# Patient Record
Sex: Female | Born: 1935 | Race: White | Hispanic: No | Smoking: Never smoker
Health system: Southern US, Community
[De-identification: ages and names within clinical notes are randomized; demographics above are authoritative.]

## PROBLEM LIST (undated history)

## (undated) DIAGNOSIS — I10 Essential (primary) hypertension: Secondary | ICD-10-CM

## (undated) DIAGNOSIS — E119 Type 2 diabetes mellitus without complications: Secondary | ICD-10-CM

## (undated) DIAGNOSIS — I2510 Atherosclerotic heart disease of native coronary artery without angina pectoris: Secondary | ICD-10-CM

## (undated) DIAGNOSIS — N289 Disorder of kidney and ureter, unspecified: Secondary | ICD-10-CM

## (undated) DIAGNOSIS — F419 Anxiety disorder, unspecified: Secondary | ICD-10-CM

## (undated) DIAGNOSIS — I5032 Chronic diastolic (congestive) heart failure: Secondary | ICD-10-CM

## (undated) DIAGNOSIS — I480 Paroxysmal atrial fibrillation: Secondary | ICD-10-CM

## (undated) DIAGNOSIS — Z8673 Personal history of transient ischemic attack (TIA), and cerebral infarction without residual deficits: Secondary | ICD-10-CM

## (undated) DIAGNOSIS — I214 Non-ST elevation (NSTEMI) myocardial infarction: Secondary | ICD-10-CM

## (undated) DIAGNOSIS — E782 Mixed hyperlipidemia: Secondary | ICD-10-CM

## (undated) HISTORY — PX: CHOLECYSTECTOMY: SHX55

## (undated) HISTORY — DX: Disorder of kidney and ureter, unspecified: N28.9

## (undated) HISTORY — DX: Personal history of transient ischemic attack (TIA), and cerebral infarction without residual deficits: Z86.73

## (undated) HISTORY — DX: Atherosclerotic heart disease of native coronary artery without angina pectoris: I25.10

## (undated) HISTORY — DX: Essential (primary) hypertension: I10

## (undated) HISTORY — PX: OTHER SURGICAL HISTORY: SHX169

## (undated) HISTORY — DX: Type 2 diabetes mellitus without complications: E11.9

## (undated) HISTORY — DX: Mixed hyperlipidemia: E78.2

## (undated) HISTORY — PX: BACK SURGERY: SHX140

## (undated) HISTORY — DX: Non-ST elevation (NSTEMI) myocardial infarction: I21.4

---

## 2007-11-27 ENCOUNTER — Inpatient Hospital Stay (HOSPITAL_COMMUNITY): Admission: RE | Admit: 2007-11-27 | Discharge: 2007-12-01 | Payer: Self-pay | Admitting: Neurosurgery

## 2009-11-29 ENCOUNTER — Ambulatory Visit: Payer: Self-pay | Admitting: Cardiology

## 2010-07-26 NOTE — Op Note (Signed)
[name], [name]             ACCOUNT NO.:  [id number]   MEDICAL RECORD NO.:  [id number]          PATIENT TYPE:  INP   LOCATION:  5018                         FACILITY:  [facility]   PHYSICIAN:  Hilda Lias, M.D.   DATE OF BIRTH:  01-Jun-1924   DATE OF PROCEDURE:  11/27/2007  DATE OF DISCHARGE:                               OPERATIVE REPORT   PREOPERATIVE DIAGNOSES:  L4-L5 spondylolisthesis with right L4-L5  radiculopathy, status post diskectomy and fusion, June 2009.   POSTOPERATIVE DIAGNOSES:  L4-L5 spondylolisthesis with right L4-L5  radiculopathy, status post diskectomy and fusion, June 2009.   PROCEDURE:  L4 Gill procedure.  Bilateral L4-L5 diskectomy and  facetectomy, interbody fusion with cages, pedicle screws at L4-L5,  posterolateral arthrodesis with BMP cell saver.   CLINICAL HISTORY:  [name] is an 77 year old female who in June had  an L4-L5 diskectomy with fusion.  According to her family, she did worse  after surgery.  During surgery, they found quite a bit of CSF.  She has  unilateral transverse facet fusion.  She came to my office yesterday  complaining of worsening of the pain.  She had full conservative  treatment without improvement.  The patient wanted to proceed with  surgery.  The family knew all the risks including the possibility of no  improvement or worsening.   PROCEDURE:  The patient was taken to the OR and she was positioned in a  prone manner.  The back was cleaned with DuraPrep.  A midline incision  from the previous one was made.  We started dissection in the left side  first.  We were able to identify the L4 and L5 space.  On the right  side, we found the area where she had a surgery, which was mostly with  quite a bit of fibrosis.  Two x-rays were taken, which showed that  indeed we were at the level of the L4-L5.  Because of the CSF leak on  the right side, we approached the procedure on the left side, proceeding  with Gill procedure at L4  which included the facet.  We found quite a  bit of adhesion at the level of L4-L5 on the left side.  Lysis was  accomplished and we were able to get into disk space first on the left  side.  Then, we removed the facet on the right side.  We did quite a bit  of lysis of adhesions and we were able to decompress the L4 and L5 nerve  root.  We entered the disk space and we did a total close diskectomy to  decompress the area more than normal that is done for planned  diskectomy.  The reason for the total diskectomy all the way laterally  to be able to introduce the cages.  After the endplates were removed, 2  cages, 8 x 22 were inserted at the level of L4-L5 space using autograft  and BMP.  Then attention was turned to the right side.  We found some  yellow ligament compromising the L5 nerve root and that was cleaned.  Then at the  level of L4, we found the takeoff of L4 was  adherent to the  lateral wall.  Using the microdissection, we were able to free the L4  nerve root.  On top of it we found the patient has right after a tissue  graft as well as some stitch.  The takeoff of the L4 nerve root was  surrounded by granular tissue.  Then using the C-arm in AP and lateral  view, we probed the pedicles of L4-L5 and we introduced 4 screws of 5.5  x 4.5.  They were  kept in place with __________ and capped.  The periosteum of the  transverse process were removed and a mixture of BMP around the graft  was done for arthrodesis.  __________.  From there on the area was  irrigated and closed with Vicryl x 3.     ______________________________  Michel Harrow, M.D.    ______________________________  Hilda Lias, M.D.    KB/MEDQ  D:  11/27/2007  T:  11/28/2007  Job:  [id number]

## 2010-07-29 NOTE — Discharge Summary (Signed)
[name], [name]             ACCOUNT NO.:  [id number]   MEDICAL RECORD NO.:  [id number]          PATIENT TYPE:  INP   LOCATION:  5018                         FACILITY:  [facility]   PHYSICIAN:  Hilda Lias, M.D.   DATE OF BIRTH:  1924/05/21   DATE OF ADMISSION:  11/27/2007  DATE OF DISCHARGE:  12/01/2007                               DISCHARGE SUMMARY   ADMISSION DIAGNOSIS:  L4-L5 spondylolisthesis with bilateral  radiculopathy.   DISCHARGE DIAGNOSIS:  L4-L5 spondylolisthesis with bilateral  radiculopathy.   CLINICAL HISTORY:  The patient was admitted because of back pain  radiating towards the leg.  X-ray shows spondylolisthesis at the L4-L5.  Surgery was advised.   LABORATORY:  Normal.   HOSPITAL COURSE:  The patient was taken to surgery and L4-L5 fusion with  pedicle screw was done.  The patient did well up to the point and later  48 hours, she was discharged by Dr. Tressie Stalker.   CONDITION ON DISCHARGE:  Improvement.   MEDICATIONS:  Percocet and diazepam.   DIET:  Regular.   ACTIVITY:  Not to drive for at least 4 weeks.   FOLLOWUP:  She is going to be seen by me in my office in 4 weeks.  The  patient was discharged by Dr. Lovell Sheehan on November 30, 2007.           ______________________________  Hilda Lias, M.D.     EB/MEDQ  D:  12/31/2007  T:  12/31/2007  Job:  [id number]

## 2010-12-12 LAB — GLUCOSE, CAPILLARY
Glucose-Capillary: 107 — ABNORMAL HIGH
Glucose-Capillary: 120 — ABNORMAL HIGH
Glucose-Capillary: 123 — ABNORMAL HIGH
Glucose-Capillary: 135 — ABNORMAL HIGH
Glucose-Capillary: 136 — ABNORMAL HIGH
Glucose-Capillary: 160 — ABNORMAL HIGH
Glucose-Capillary: 191 — ABNORMAL HIGH
Glucose-Capillary: 91

## 2010-12-12 LAB — BASIC METABOLIC PANEL
CO2: 24
Calcium: 9.3
Chloride: 105
GFR calc Af Amer: >60
Glucose, Bld: 119 — ABNORMAL HIGH
Potassium: 3.5
Sodium: 137

## 2010-12-12 LAB — CBC
HCT: 34.3 — ABNORMAL LOW
Hemoglobin: 11.6 — ABNORMAL LOW
MCHC: 33.8
MCV: 89.5
RBC: 3.84 — ABNORMAL LOW
RDW: 12.7

## 2010-12-12 LAB — TYPE AND SCREEN: Antibody Screen: NEGATIVE

## 2011-06-02 ENCOUNTER — Inpatient Hospital Stay (HOSPITAL_COMMUNITY)
Admission: AD | Admit: 2011-06-02 | Discharge: 2011-06-08 | DRG: 281 | Disposition: A | Discharge status: Home / Self Care | Payer: Medicare Other | Source: Other Acute Inpatient Hospital | Attending: Cardiovascular Disease | Admitting: Cardiovascular Disease

## 2011-06-02 ENCOUNTER — Encounter (HOSPITAL_COMMUNITY): Payer: Self-pay | Admitting: *Deleted

## 2011-06-02 DIAGNOSIS — I2510 Atherosclerotic heart disease of native coronary artery without angina pectoris: Secondary | ICD-10-CM

## 2011-06-02 DIAGNOSIS — Z9181 History of falling: Secondary | ICD-10-CM

## 2011-06-02 DIAGNOSIS — I5032 Chronic diastolic (congestive) heart failure: Secondary | ICD-10-CM | POA: Diagnosis present

## 2011-06-02 DIAGNOSIS — I214 Non-ST elevation (NSTEMI) myocardial infarction: Secondary | ICD-10-CM | POA: Diagnosis present

## 2011-06-02 DIAGNOSIS — Z7982 Long term (current) use of aspirin: Secondary | ICD-10-CM

## 2011-06-02 DIAGNOSIS — R197 Diarrhea, unspecified: Secondary | ICD-10-CM | POA: Diagnosis present

## 2011-06-02 DIAGNOSIS — I495 Sick sinus syndrome: Secondary | ICD-10-CM

## 2011-06-02 DIAGNOSIS — I10 Essential (primary) hypertension: Secondary | ICD-10-CM | POA: Diagnosis present

## 2011-06-02 DIAGNOSIS — I498 Other specified cardiac arrhythmias: Secondary | ICD-10-CM | POA: Diagnosis present

## 2011-06-02 DIAGNOSIS — E876 Hypokalemia: Secondary | ICD-10-CM | POA: Diagnosis present

## 2011-06-02 DIAGNOSIS — I4891 Unspecified atrial fibrillation: Secondary | ICD-10-CM | POA: Diagnosis present

## 2011-06-02 DIAGNOSIS — E860 Dehydration: Secondary | ICD-10-CM | POA: Diagnosis present

## 2011-06-02 DIAGNOSIS — I509 Heart failure, unspecified: Secondary | ICD-10-CM | POA: Diagnosis present

## 2011-06-02 DIAGNOSIS — R079 Chest pain, unspecified: Secondary | ICD-10-CM

## 2011-06-02 DIAGNOSIS — Z79899 Other long term (current) drug therapy: Secondary | ICD-10-CM

## 2011-06-02 HISTORY — DX: Anxiety disorder, unspecified: F41.9

## 2011-06-02 HISTORY — DX: Chronic diastolic (congestive) heart failure: I50.32

## 2011-06-02 HISTORY — DX: Paroxysmal atrial fibrillation: I48.0

## 2011-06-03 ENCOUNTER — Encounter (HOSPITAL_COMMUNITY): Payer: Self-pay | Admitting: *Deleted

## 2011-06-03 DIAGNOSIS — E876 Hypokalemia: Secondary | ICD-10-CM | POA: Diagnosis present

## 2011-06-03 DIAGNOSIS — I214 Non-ST elevation (NSTEMI) myocardial infarction: Principal | ICD-10-CM

## 2011-06-03 DIAGNOSIS — I059 Rheumatic mitral valve disease, unspecified: Secondary | ICD-10-CM

## 2011-06-03 DIAGNOSIS — I4891 Unspecified atrial fibrillation: Secondary | ICD-10-CM

## 2011-06-03 LAB — CARDIAC PANEL(CRET KIN+CKTOT+MB+TROPI)
Relative Index: 3.6 — ABNORMAL HIGH (ref 0.0–2.5)
Relative Index: 2.8 — ABNORMAL HIGH (ref 0.0–2.5)
Total CK: 245 U/L — ABNORMAL HIGH (ref 7–177)
Troponin I: 5.08 ng/mL (ref <0.30)
Troponin I: 3.06 ng/mL (ref <0.30)
Troponin I: 2.25 ng/mL (ref <0.30)

## 2011-06-03 LAB — MRSA PCR SCREENING: MRSA by PCR: NEGATIVE

## 2011-06-03 LAB — GLUCOSE, CAPILLARY
Glucose-Capillary: 133 mg/dL — ABNORMAL HIGH (ref 70–99)
Glucose-Capillary: 145 mg/dL — ABNORMAL HIGH (ref 70–99)

## 2011-06-03 LAB — CBC
HCT: 33.6 % — ABNORMAL LOW (ref 36.0–46.0)
Hemoglobin: 11.5 g/dL — ABNORMAL LOW (ref 12.0–15.0)
MCH: 29.2 pg (ref 26.0–34.0)
MCHC: 34.2 g/dL (ref 30.0–36.0)
RDW: 13.6 % (ref 11.5–15.5)

## 2011-06-03 LAB — LIPID PANEL
Cholesterol: 121 mg/dL (ref 0–200)
HDL: 33 mg/dL — ABNORMAL LOW (ref >39)
Total CHOL/HDL Ratio: 3.7 RATIO
VLDL: 27 mg/dL (ref 0–40)

## 2011-06-03 LAB — BASIC METABOLIC PANEL
BUN: 24 mg/dL — ABNORMAL HIGH (ref 6–23)
Calcium: 8.5 mg/dL (ref 8.4–10.5)
GFR calc Af Amer: 45 mL/min — ABNORMAL LOW (ref >90)
GFR calc non Af Amer: 39 mL/min — ABNORMAL LOW (ref >90)
Glucose, Bld: 139 mg/dL — ABNORMAL HIGH (ref 70–99)

## 2011-06-03 LAB — HEPARIN LEVEL (UNFRACTIONATED)
Heparin Unfractionated: 0.1 IU/mL — ABNORMAL LOW (ref 0.30–0.70)
Heparin Unfractionated: 0.25 IU/mL — ABNORMAL LOW (ref 0.30–0.70)

## 2011-06-03 MED ORDER — POTASSIUM CHLORIDE CRYS ER 20 MEQ PO TBCR
40.0000 meq | EXTENDED_RELEASE_TABLET | Freq: Once | ORAL | Status: AC
  Administered 2011-06-03: 40 meq via ORAL

## 2011-06-03 MED ORDER — ALUM & MAG HYDROXIDE-SIMETH 200-200-20 MG/5ML PO SUSP
15.0000 mL | ORAL | Status: DC | PRN
  Administered 2011-06-04 – 2011-06-05 (×6): 30 mL via ORAL
  Filled 2011-06-03 (×6): qty 30

## 2011-06-03 MED ORDER — ALPRAZOLAM 0.25 MG PO TABS
0.2500 mg | ORAL_TABLET | Freq: Once | ORAL | Status: AC
  Administered 2011-06-03: 0.25 mg via ORAL

## 2011-06-03 MED ORDER — POTASSIUM CHLORIDE CRYS ER 20 MEQ PO TBCR
40.0000 meq | EXTENDED_RELEASE_TABLET | Freq: Two times a day (BID) | ORAL | Status: DC
  Administered 2011-06-03 – 2011-06-04 (×4): 40 meq via ORAL
  Filled 2011-06-03 (×7): qty 2

## 2011-06-03 MED ORDER — METOPROLOL TARTRATE 12.5 MG HALF TABLET
12.5000 mg | ORAL_TABLET | Freq: Two times a day (BID) | ORAL | Status: DC
  Administered 2011-06-03 – 2011-06-07 (×8): 12.5 mg via ORAL
  Filled 2011-06-03 (×10): qty 1

## 2011-06-03 MED ORDER — MAGNESIUM HYDROXIDE 400 MG/5ML PO SUSP
30.0000 mL | Freq: Every day | ORAL | Status: DC | PRN
  Administered 2011-06-03: 30 mL via ORAL
  Filled 2011-06-03: qty 30

## 2011-06-03 MED ORDER — ALPRAZOLAM 0.25 MG PO TABS
0.1250 mg | ORAL_TABLET | Freq: Two times a day (BID) | ORAL | Status: DC
  Administered 2011-06-03: 0.125 mg via ORAL
  Administered 2011-06-03: 16:00:00 via ORAL
  Administered 2011-06-04: 0.125 mg via ORAL
  Filled 2011-06-03 (×2): qty 1

## 2011-06-03 MED ORDER — NITROGLYCERIN IN D5W 200-5 MCG/ML-% IV SOLN
2.0000 ug/min | INTRAVENOUS | Status: DC
  Administered 2011-06-03 – 2011-06-04 (×2): 10 ug/min via INTRAVENOUS
  Administered 2011-06-04: 5 ug/min via INTRAVENOUS
  Filled 2011-06-03: qty 250

## 2011-06-03 MED ORDER — ALPRAZOLAM 0.25 MG PO TABS
0.2500 mg | ORAL_TABLET | Freq: Every day | ORAL | Status: DC
  Administered 2011-06-03 – 2011-06-07 (×5): 0.25 mg via ORAL
  Filled 2011-06-03 (×7): qty 1

## 2011-06-03 MED ORDER — NITROGLYCERIN 0.4 MG SL SUBL: 0.4000 mg | SUBLINGUAL_TABLET | SUBLINGUAL | Status: DC | PRN

## 2011-06-03 MED ORDER — ACETAMINOPHEN 325 MG PO TABS: 650.0000 mg | ORAL_TABLET | ORAL | Status: DC | PRN

## 2011-06-03 MED ORDER — HEPARIN (PORCINE) IN NACL 100-0.45 UNIT/ML-% IJ SOLN
1000.0000 [IU]/h | INTRAMUSCULAR | Status: DC
  Administered 2011-06-03 – 2011-06-04 (×2): 1150 [IU]/h via INTRAVENOUS
  Administered 2011-06-05 – 2011-06-06 (×2): 1000 [IU]/h via INTRAVENOUS
  Filled 2011-06-03 (×6): qty 250

## 2011-06-03 MED ORDER — SIMVASTATIN 20 MG PO TABS
20.0000 mg | ORAL_TABLET | Freq: Every evening | ORAL | Status: DC
  Administered 2011-06-03 – 2011-06-07 (×4): 20 mg via ORAL
  Filled 2011-06-03 (×6): qty 1

## 2011-06-03 MED ORDER — ASPIRIN 300 MG RE SUPP: 300.0000 mg | RECTAL | Status: AC

## 2011-06-03 MED ORDER — HYDRALAZINE HCL 10 MG PO TABS
10.0000 mg | ORAL_TABLET | Freq: Three times a day (TID) | ORAL | Status: DC
  Administered 2011-06-03 – 2011-06-08 (×15): 10 mg via ORAL
  Filled 2011-06-03 (×20): qty 1

## 2011-06-03 MED ORDER — ONDANSETRON HCL 4 MG/2ML IJ SOLN
4.0000 mg | Freq: Four times a day (QID) | INTRAMUSCULAR | Status: DC | PRN
  Administered 2011-06-03: 4 mg via INTRAVENOUS
  Filled 2011-06-03: qty 2

## 2011-06-03 MED ORDER — GUAIFENESIN-DM 100-10 MG/5ML PO SYRP
15.0000 mL | ORAL_SOLUTION | ORAL | Status: DC | PRN
  Filled 2011-06-03: qty 15

## 2011-06-03 MED ORDER — ALPRAZOLAM 0.25 MG PO TABS: 0.1250 mg | ORAL_TABLET | ORAL | Status: DC

## 2011-06-03 MED ORDER — SODIUM CHLORIDE 0.9 % IV SOLN
INTRAVENOUS | Status: AC
  Administered 2011-06-03 (×2): via INTRAVENOUS

## 2011-06-03 MED ORDER — ASPIRIN 81 MG PO CHEW: 324.0000 mg | CHEWABLE_TABLET | ORAL | Status: AC

## 2011-06-03 MED ORDER — ASPIRIN EC 81 MG PO TBEC
81.0000 mg | DELAYED_RELEASE_TABLET | Freq: Every day | ORAL | Status: DC
  Administered 2011-06-04 – 2011-06-08 (×5): 81 mg via ORAL
  Filled 2011-06-03 (×5): qty 1

## 2011-06-03 MED ORDER — HEPARIN BOLUS VIA INFUSION
2000.0000 [IU] | Freq: Once | INTRAVENOUS | Status: AC
  Administered 2011-06-03: 2000 [IU] via INTRAVENOUS
  Filled 2011-06-03: qty 2000

## 2011-06-03 NOTE — Progress Notes (Addendum)
  Patient Name: [name]      SUBJECTIVE: admitted from [facility] with AFib RVR and + Tn in setting of viral gastroenteritis like syndrome complicatted by dehydration and acute on chronic renal insufficiency  She is stillweak and with loose stools  Some chest burning Rx with mylanta this am with relief  Past Medical History  Diagnosis Date  . Hypertension   . Diabetes mellitus     borderline  . Stroke     mini stroke  . Anxiety     PHYSICAL EXAM Filed Vitals:   06/03/11 0500 06/03/11 0800 06/03/11 0802 06/03/11 1156  BP:  136/53 136/53 178/59  Pulse:  57 59 66  Temp:   97.9 F (36.6 C) 97.9 F (36.6 C)  TempSrc:   Oral Oral  Resp:  13 15 23   Height:      Weight: 155 lb 3.3 oz (70.4 kg)     SpO2:  100% 99% 100%    Well developed and nourished in no acute distress HENT normal Neck supple with JVP-flat Clear Regular rate and rhythm, no murmurs or gallops Abd-soft with active BS No Clubbing cyanosis edema Skin-warm and dry A & Oriented  Grossly normal sensory and motor function   TELEMETRY: Reviewed telemetry pt in NSR    Intake/Output Summary (Last 24 hours) at 06/03/11 1238 Last data filed at 06/03/11 1149  Gross per 24 hour  Intake 1403.25 ml  Output   1151 ml  Net 252.25 ml    LABS: Basic Metabolic Panel:  Lab 06/03/11 1610  NA 136  K 3.9  CL 107  CO2 19  GLUCOSE 139*  BUN 24*  CREATININE 1.23*  CALCIUM 8.5  MG --  PHOS --   Cardiac Enzymes:  Basename 06/03/11 0730 06/03/11 0043  CKTOTAL 245* 307*  CKMB 8.7* 11.0*  CKMBINDEX -- --  TROPONINI 3.06* 5.08*   CBC:  Lab 06/03/11 0730  WBC 7.1  NEUTROABS --  HGB 11.5*  HCT 33.6*  MCV 85.3  PLT 186   PROTIME:  Basename 06/03/11 0730  LABPROT 14.4  INR 1.10  Fasting Lipid Panel:  Basename 06/03/11 0730  CHOL 121  HDL 33*  LDLCALC 61  TRIG 960  CHOLHDL 3.7  LDLDIRECT --      ASSESSMENT AND PLAN:  Patient Active Hospital Problem List: NSTEMI (non-ST elevated  myocardial infarction)  With recurrent chestpain   Atrial fibrillation with rapid ventricular response (06/02/2011)  * Dehydration and diarrhea   Hypertension (06/02/2011)   Hypokalemia (06/03/2011)   will begin nitro for chest pain  And recycle enzymes    Has had bradycardia so will not adjust betablockers  Continue rehydratoin  Echo pending \ Cath prob tues as renal function normalizes    Signed, Sherryl Manges MD  06/03/2011

## 2011-06-03 NOTE — H&P (Signed)
[name], FONT             ACCOUNT NO.:  [id number]  MEDICAL RECORD NO.:  [id number]  LOCATION:  2605                         FACILITY:  [facility]  PHYSICIAN:  Natasha Bence, MD       DATE OF BIRTH:  04/17/1924  DATE OF ADMISSION:  06/02/2011 DATE OF DISCHARGE:                             HISTORY & PHYSICAL   CHIEF COMPLAINT:  Weakness and chest pain.  HISTORY OF PRESENT ILLNESS:  Ms. [name] is an 78 year old, white female who presented to the [facility] Emergency Department after several-day history of nausea, vomiting, and diarrhea.  She reports she has been very ill since Tuesday and has not been able to take any food down.  She has had multiple episodes of diarrhea and vomiting daily.  Last night, she was feeling very weak and when she arose from bed, she fell down. She did not hit her head or lose consciousness.  She felt very weak thereafter and abruptly developed substernal chest discomfort.  She thought this chest discomfort was related to her fall and catching herself; however, the chest pain did not hurt with movement.  She had no associated shortness of breath, but was profoundly diaphoretic during this episode.  She said it lasted at least 5-10 minutes and spontaneously resolved.  She had no palpitations or lightheadedness after this episode.  Prior to being sick this week, she has had no chest discomfort, shortness of breath, dyspnea on exertion.  She denies any history of PND, orthopnea or lower extremity edema.  She has never had any palpitations.  She does have chronic leg pain and back pain. Otherwise, she does not report any weight loss, fevers, chills, or sweats prior to this week.  She denies any bleeding or melena. In the [facility] Emergency Department, she was found to be in atrial fibrillation with rapid ventricular rate and spontaneously converted back to sinus mechanism on her own.  Currently, she feels little bit better, although weak and thirsty.  The  rest of complete review of systems was performed and was otherwise negative except for what was stated above.  PAST MEDICAL HISTORY: 1. Hypertension. 2. Dyslipidemia. 3. Carotid vascular disease. 4. History of a pancreatic mass, being followed by Oncology     conservatively. 5. Chronic back pain. 6. Prediabetes.  PAST SURGICAL HISTORY:  She had a cholecystectomy and back surgery, wrist surgery, and cataract surgery.  FAMILY HISTORY:  Mom with coronary artery disease in her 46s.  SOCIAL HISTORY:  She is nonsmoker and nondrinker.  She lives at home with her daughter.  ALLERGIES:  She has no known drug allergies.  MEDICATIONS:  She is on, 1. Simvastatin 20 mg p.o. at bedtime. 2. Zestril 10 mg p.o. daily. 3. Aspirin 325 mg p.o. daily. 4. Hydralazine 10 mg p.o. t.i.d. 5. Hydrochlorothiazide 12.5 mg p.o. daily.  She has been holding this     for a week. 6. Xanax 0.5 mg p.o. b.i.d. and at bedtime p.r.n. 7. Zofran 8 mg p.o. t.i.d. p.r.n.  PHYSICAL EXAMINATION:  VITAL SIGNS:  She is afebrile.  Temperature 97.8, pulse of 72 and regular, respiratory rate of 16, blood pressure 149/55, O2 sats 100% on room air. GENERAL:  She is  a well-developed, well-nourished, elderly white female, in no apparent distress. HEENT:  Eyes:  She has anicteric sclerae.  Pupils are equally round and reactive.  Mucous membranes are dry. NECK:  She has normal jugular venous pressure.  No carotid bruits. LUNGS:  Clear to auscultation bilaterally. CARDIOVASCULAR:  She has a regular rate and rhythm.  No murmurs, rubs, or gallops. ABDOMEN:  Soft, nontender, nondistended.  No bruits. EXTREMITIES:  Warm with no edema.  She has symmetrical pulses throughout. SKIN:  No rash or ulcers. NEUROLOGIC:  Grossly afocal.  LABS DONE AT [facility]:  She has a sodium of 133, potassium of 2.7, chloride of 98, bicarb of 19, BUN of 30, creatinine 1.53, glucose of 140, calcium of 8.9.  AST of 51, ALT of 22, alk phos of 56.   CK is 400, CK-MB was 27.9, troponin was 5.34.  BNP was 335.  TSH was 2.58 and lipase was 29.  PT was 12.8, INR was 0.9, PTT was 33.2.  White blood cell count of 7.4, hematocrit of 37.4, and platelet count of 175.  Her white count had a normal differential.  She had a CT of head done at that outside hospital showed no acute intracranial abnormality.  She had a small right frontal scalp hematoma, no skull fracture.  She had an old occipital lobe stroke and otherwise atrophy.  Abdominal x-ray showed possible left colonic wall thickening suspicious for colitis.  Both lungs were clear.  EKG; first one shows atrial fibrillation with a ventricular rate run about 100.  She has left bundle-branch block.  Repeat EKG shows sinus mechanism with a rate of 77 beats per minute.  She has intraventricular conduction delay and left axis deviation.  She has evidence for previous anterior infarction.  She also has ST depression anterolaterally.  IMPRESSION AND PLAN:  This is an 78 year old white female with recent viral gastroenteritis who presented with weakness and profound dehydration.  Subsequently, found to be an atrial fibrillation with rapid ventricular rate, which spontaneously converted.  She also had one episode of chest discomfort related to the sequelae.  EKG does have ischemic changes.  Her initial troponin at the outside hospital was 5.3. Currently, she is pain free and hemodynamically stable.  We will continue IV fluid hydration and replacement of her potassium, which are likely culprits for much of her complications.  We will place her on low- dose metoprolol.  Of note, she has had a history of bradycardia with rates in the 40s and near syncope home in the past, on beta-blocker. So, we will displaced her on this now to prevent her from further episodes of atrial fibrillation while she is in the hospital and likely discontinue this on discharge.  Normally, she is also on hydrochlorothiazide  and lisinopril for blood pressure; however, given her dehydration and acute renal dysfunction, we will hydrate her first and then restart this once her serum creatinine has normalized.  Her daughter does say she has some degree of renal dysfunction at baseline. We will treat her with antiemetics as needed; otherwise, she is already on aspirin and heparin infusion and simvastatin.  I feel her non-ST segment elevation myocardial infarction is likely related to profound dehydration and her atrial fibrillation.  We will check serial cardiac enzymes through the night and then in the morning. Check an echocardiogram to evaluate her left ventricular systolic function.  Depending on how this results come out, we may decide on the course of action with either invasive versus noninvasive workup  for her coronary artery disease.          ______________________________ Natasha Bence, MD     MH/MEDQ  D:  06/03/2011  T:  06/03/2011  Job:  [id number]

## 2011-06-03 NOTE — Progress Notes (Signed)
  Echocardiogram 2D Echocardiogram has been performed.  Diamante Rubin, Real Cons 06/03/2011, 2:54 PM

## 2011-06-03 NOTE — Progress Notes (Signed)
Received critical result from lab. CKMB-11.0, Troponin of 5.08. Will call the MD.

## 2011-06-03 NOTE — Progress Notes (Signed)
Pts. Cardiac enzyme result came back +, critical report received from lab. Pt. denies any chest pain or any other symptoms, Dr. Judie Petit. Margo Aye was paged and made aware. Wil cont. to monitor pt. Marland Kitchen

## 2011-06-03 NOTE — H&P (Signed)
See dictated note.  78 yo admitted with recent gastroenteritis and dehydration and profound hypokalemia developed weakness, afib with RVR and NSTEMI.  Troponin 5 and ECG with ischemic changes.  Currently pain free.  Also has some renal dysfunction.    Most of these complications are likely related to her gastroenteritis and dehydration leading to afib/RVR and NSTEMI.  Will follow serial troponins and get ECHO in am.  Hydrate with IVF's and replace K+.  On Heparin, ASA, Statin, metoprolol.

## 2011-06-03 NOTE — Progress Notes (Addendum)
ANTICOAGULATION CONSULT NOTE - Follow Up Consult  Pharmacy Consult for heparin Indication: afib/acs  No Known Allergies  Patient Measurements: Height: 5\' 3"  (160 cm) Weight: 155 lb 3.3 oz (70.4 kg) IBW/kg (Calculated) : 52.4  Heparin Dosing Weight:   Vital Signs: Temp: 97.9 F (36.6 C) (03/23 0802) Temp src: Oral (03/23 0802) BP: 136/53 mmHg (03/23 0802) Pulse Rate: 59  (03/23 0802)  Labs:  Basename 06/03/11 0730 06/03/11 0043  HGB -- --  HCT -- --  PLT -- --  APTT -- --  LABPROT 14.4 --  INR 1.10 --  HEPARINUNFRC 0.10* --  CREATININE 1.23* --  CKTOTAL 245* 307*  CKMB 8.7* 11.0*  TROPONINI 3.06* 5.08*   Estimated Creatinine Clearance: 32 ml/min (by C-G formula based on Cr of 1.23).  Assessment:  78 yo female with Afib/chest pain for Heparin. Heparin level subtherapeutic this AM. Platelets wnl. Per nurse, no issues with line overnight and no S/Sx bleeding.  Goal of Therapy:  Heparin level 0.3-0.7 units/ml   Plan:  Rebolus with Heparin 2000 units. Increase rate of Heparin infusion to 1050 units/hr = 10.5 mL/hr Follow up heparin level 8 hours after rate change and monitor CBC.  Goal of Therapy:  Heparin level 0.3-0.7 units/ml   Zoe Lan, PharmD pgr [phone] 06/03/2011, 8:39 AM

## 2011-06-03 NOTE — Progress Notes (Signed)
ANTICOAGULATION CONSULT NOTE - Follow Up Consult  Pharmacy Consult for heparin Indication: afib/acs  No Known Allergies  Patient Measurements: Height: 5\' 3"  (160 cm) Weight: 155 lb 3.3 oz (70.4 kg) IBW/kg (Calculated) : 52.4  Heparin Dosing Weight:   Vital Signs: Temp: 97.9 F (36.6 C) (03/23 1156) Temp src: Oral (03/23 1156) BP: 137/51 mmHg (03/23 1500) Pulse Rate: 55  (03/23 1500)  Labs:  Basename 06/03/11 1631 06/03/11 0730 06/03/11 0043  HGB -- 11.5* --  HCT -- 33.6* --  PLT -- 186 --  APTT -- -- --  LABPROT -- 14.4 --  INR -- 1.10 --  HEPARINUNFRC 0.25* 0.10* --  CREATININE -- 1.23* --  CKTOTAL -- 245* 307*  CKMB -- 8.7* 11.0*  TROPONINI -- 3.06* 5.08*   Estimated Creatinine Clearance: 32 ml/min (by C-G formula based on Cr of 1.23).  Assessment:  78 yo female with Afib/chest pain for Heparin. Heparin level still  subtherapeutic.   Goal of Therapy:  Heparin level 0.3-0.7 units/ml   Plan:  Increase rate of Heparin infusion to 1150 units/hr = 11.5 mL/hr Follow up heparin level 8 hours after rate change.    Talbert Cage, PharmD  06/03/2011, 5:31 PM

## 2011-06-03 NOTE — Progress Notes (Signed)
ANTICOAGULATION CONSULT NOTE - Initial Consult  Pharmacy Consult for Heparin Indication: Afib/ACS   No Known Allergies  Patient Measurements: Height: 5\' 3"  (160 cm) Weight: 155 lb 3.3 oz (70.4 kg) IBW/kg (Calculated) : 52.4   Vital Signs: Temp: 97.8 F (36.6 C) (03/22 2255) Temp src: Oral (03/22 2255) BP: 149/55 mmHg (03/22 2255) Pulse Rate: 72  (03/22 2255)  Labs (at OSH): WBC 7.4 Hgb 12.8 Hct 37.4 Plt 175  SCr 1.53  INR 0.9 PTT 33   Medical History: Past Medical History  Diagnosis Date  . Hypertension   . Diabetes mellitus     Medications:  Prescriptions prior to admission  Medication Sig Dispense Refill  . ALPRAZolam (XANAX) 0.25 MG tablet Take 0.125-0.25 mg by mouth See admin instructions. Take 0.5 tablets (0.125 MG) twice daily, and take 1 tablet (0.25 MG) at bedtime.      Marland Kitchen aspirin EC 325 MG tablet Take 325 mg by mouth daily.      . hydrALAZINE (APRESOLINE) 10 MG tablet Take 10 mg by mouth 3 (three) times daily.      . hydrochlorothiazide (MICROZIDE) 12.5 MG capsule Take 12.5 mg by mouth daily.      Marland Kitchen lisinopril (PRINIVIL,ZESTRIL) 10 MG tablet Take 10 mg by mouth daily.      . ondansetron (ZOFRAN) 8 MG tablet Take 8 mg by mouth every 8 (eight) hours as needed. For nausea.      . simvastatin (ZOCOR) 20 MG tablet Take 20 mg by mouth every evening.        Assessment: 78 yo female with Afib/chest pain for Heparin.  Heparin 4000 units IV bolus,  800 units/hr started at 1900 3/22  Goal of Therapy:  Heparin level 0.3-0.7 units/ml   Plan:  Continue Heparin at current rate  Follow-up am labs.   Loyed Wilmes, Gary Fleet 06/03/2011,12:37 AM

## 2011-06-04 LAB — CBC
HCT: 30.8 % — ABNORMAL LOW (ref 36.0–46.0)
MCV: 86 fL (ref 78.0–100.0)
RBC: 3.58 MIL/uL — ABNORMAL LOW (ref 3.87–5.11)
RDW: 13.7 % (ref 11.5–15.5)
WBC: 9.8 10*3/uL (ref 4.0–10.5)

## 2011-06-04 LAB — BASIC METABOLIC PANEL
BUN: 18 mg/dL (ref 6–23)
CO2: 19 mEq/L (ref 19–32)
Chloride: 106 mEq/L (ref 96–112)
Creatinine, Ser: 0.91 mg/dL (ref 0.50–1.10)
Glucose, Bld: 150 mg/dL — ABNORMAL HIGH (ref 70–99)

## 2011-06-04 LAB — GLUCOSE, CAPILLARY
Glucose-Capillary: 130 mg/dL — ABNORMAL HIGH (ref 70–99)
Glucose-Capillary: 161 mg/dL — ABNORMAL HIGH (ref 70–99)

## 2011-06-04 LAB — CARDIAC PANEL(CRET KIN+CKTOT+MB+TROPI)
CK, MB: 2.7 ng/mL (ref 0.3–4.0)
Total CK: 94 U/L (ref 7–177)
Troponin I: 1.49 ng/mL (ref <0.30)

## 2011-06-04 NOTE — Progress Notes (Signed)
  Patient Name: [name]      SUBJECTIVE: admitted from [facility] with AFib RVR and NSTEMI in setting of viral gastroenteritis like syndrome complicatted by dehydration and acute on chronic renal insufficiency On IV hep and NTG    Some epgastric pain with K,  Improved with antacids  Still with loose stool but better    f Past Medical History  Diagnosis Date  . Hypertension   . Diabetes mellitus     borderline  . Stroke     mini stroke  . Anxiety     PHYSICAL EXAM Filed Vitals:   06/04/11 0700 06/04/11 0806 06/04/11 0900 06/04/11 1000  BP: 135/54 148/55 172/55 143/51  Pulse: 55 57 59 59  Temp:  98 F (36.7 C)    TempSrc:  Oral    Resp: 25 17 28 23   Height:      Weight:      SpO2: 98% 99% 99% 96%    Well developed and nourished in no acute distress HENT normal Neck supple with JVP-flat Clear Regular rate and rhythm, no murmurs or gallops Abd-soft with active BS No Clubbing cyanosis edema Skin-warm and dry A & Oriented  Grossly normal sensory and motor function   TELEMETRY: Reviewed telemetry pt in NSR    Intake/Output Summary (Last 24 hours) at 06/04/11 1124 Last data filed at 06/04/11 1000  Gross per 24 hour  Intake   1840 ml  Output   1150 ml  Net    690 ml    LABS: Basic Metabolic Panel:  Lab 06/04/11 4540 06/03/11 0730  NA 134* 136  K 4.3 3.9  CL 106 107  CO2 19 19  GLUCOSE 150* 139*  BUN 18 24*  CREATININE 0.91 1.23*  CALCIUM 8.2* 8.5  MG -- --  PHOS -- --   Cardiac Enzymes:  Basename 06/04/11 0420 06/03/11 2010 06/03/11 0730  CKTOTAL 112 142 245*  CKMB 3.0 4.0 8.7*  CKMBINDEX -- -- --  TROPONINI 2.08* 2.25* 3.06*   CBC:  Lab 06/04/11 0222 06/03/11 0730  WBC 9.8 7.1  NEUTROABS -- --  HGB 10.5* 11.5*  HCT 30.8* 33.6*  MCV 86.0 85.3  PLT 174 186   PROTIME:  Basename 06/03/11 0730  LABPROT 14.4  INR 1.10  Fasting Lipid Panel:  Basename 06/03/11 0730  CHOL 121  HDL 33*  LDLCALC 61  TRIG 981  CHOLHDL 3.7    LDLDIRECT --     Echo normal LV function with mld LAE ASSESSMENT AND PLAN:  Patient Active Hospital Problem List: NSTEMI (non-ST elevated myocardial infarction)  With recurrent chestpain Atrial fibrillation with rapid ventricular response (06/02/2011) Dehydration and diarrhea Hypertension (06/02/2011)    Despite cp tn downtrending  Has had bradycardia in past  so will not adjust betablockers  Hydration much better as diarrhea abates    \ Cath Tues as Cr is now normal but still with diarrha   Signed, Sherryl Manges MD  06/04/2011

## 2011-06-04 NOTE — Progress Notes (Signed)
ANTICOAGULATION CONSULT NOTE - Follow Up Consult  Pharmacy Consult for heparin Indication: afib/acs  No Known Allergies  Patient Measurements: Height: 5\' 3"  (160 cm) Weight: 156 lb 8.4 oz (71 kg) IBW/kg (Calculated) : 52.4  Heparin Dosing Weight:   Vital Signs: Temp: 98 F (36.7 C) (03/24 0806) Temp src: Oral (03/24 0806) BP: 148/55 mmHg (03/24 0806) Pulse Rate: 57  (03/24 0806)  Labs:  [name] 06/04/11 0420 06/04/11 0222 06/04/11 0219 06/03/11 2010 06/03/11 1631 06/03/11 0730  HGB -- 10.5* -- -- -- 11.5*  HCT -- 30.8* -- -- -- 33.6*  PLT -- 174 -- -- -- 186  APTT -- -- -- -- -- --  LABPROT -- -- -- -- -- 14.4  INR -- -- -- -- -- 1.10  HEPARINUNFRC -- -- 0.59 -- 0.25* 0.10*  CREATININE -- 0.91 -- -- -- 1.23*  CKTOTAL 112 -- -- 142 -- 245*  CKMB 3.0 -- -- 4.0 -- 8.7*  TROPONINI 2.08* -- -- 2.25* -- 3.06*   Estimated Creatinine Clearance: 43.4 ml/min (by C-G formula based on Cr of 0.91).  Assessment:  78 yo female with Afib/chest pain for Heparin. Heparin level at goal this AM. No bleeding noted.  Goal of Therapy:  Heparin level 0.3-0.7 units/ml   Plan:  Continue current heparin infusion rate of 1150 units/hr Follow up AM heparin level and CBC Would consider resuming pt's home ACEi  Zoe Lan, PharmD pgr [phone] 06/04/2011, 9:32 AM

## 2011-06-05 ENCOUNTER — Other Ambulatory Visit: Payer: Self-pay

## 2011-06-05 DIAGNOSIS — E876 Hypokalemia: Secondary | ICD-10-CM

## 2011-06-05 DIAGNOSIS — E860 Dehydration: Secondary | ICD-10-CM

## 2011-06-05 DIAGNOSIS — I214 Non-ST elevation (NSTEMI) myocardial infarction: Secondary | ICD-10-CM

## 2011-06-05 LAB — BASIC METABOLIC PANEL
BUN: 12 mg/dL (ref 6–23)
CO2: 22 mEq/L (ref 19–32)
Chloride: 103 mEq/L (ref 96–112)
GFR calc non Af Amer: 75 mL/min — ABNORMAL LOW (ref >90)
Glucose, Bld: 116 mg/dL — ABNORMAL HIGH (ref 70–99)
Potassium: 5.1 mEq/L (ref 3.5–5.1)
Sodium: 131 mEq/L — ABNORMAL LOW (ref 135–145)

## 2011-06-05 LAB — CBC
HCT: 31.7 % — ABNORMAL LOW (ref 36.0–46.0)
Hemoglobin: 10.7 g/dL — ABNORMAL LOW (ref 12.0–15.0)
MCHC: 33.8 g/dL (ref 30.0–36.0)
RBC: 3.69 MIL/uL — ABNORMAL LOW (ref 3.87–5.11)

## 2011-06-05 LAB — HEPARIN LEVEL (UNFRACTIONATED): Heparin Unfractionated: 0.47 IU/mL (ref 0.30–0.70)

## 2011-06-05 LAB — GLUCOSE, CAPILLARY: Glucose-Capillary: 123 mg/dL — ABNORMAL HIGH (ref 70–99)

## 2011-06-05 MED FILL — Heparin Sodium (Porcine) 100 Unt/ML in Sodium Chloride 0.45%: INTRAMUSCULAR | Qty: 250 | Status: AC

## 2011-06-05 NOTE — Progress Notes (Signed)
Pt HR dropped to 39 with a missed and a pause of 2.06s per the monitor. HR is now is the 50s (51-57) SB. Pt is comfortably sitting in the chair and is asymptomatic. EKG ordered.  MD notified. Will continue to monitor. See VS flowsheet.

## 2011-06-05 NOTE — Progress Notes (Signed)
ANTICOAGULATION CONSULT NOTE - Follow Up Consult  Pharmacy Consult for Heparin Indication: afib/NSTEMI  No Known Allergies  Vital Signs: Temp: 97.7 F (36.5 C) (03/25 1603) Temp src: Oral (03/25 1603) BP: 149/49 mmHg (03/25 1720) Pulse Rate: 57  (03/25 1720)  Labs:  [name] 06/05/11 1726 06/05/11 0508 06/04/11 1111 06/04/11 0420 06/04/11 0222 06/04/11 0219 06/03/11 2010 06/03/11 0730  HGB -- 10.7* -- -- 10.5* -- -- --  HCT -- 31.7* -- -- 30.8* -- -- 33.6*  PLT -- 179 -- -- 174 -- -- 186  APTT -- -- -- -- -- -- -- --  LABPROT -- -- -- -- -- -- -- 14.4  INR -- -- -- -- -- -- -- 1.10  HEPARINUNFRC 0.47 0.73* -- -- -- 0.59 -- --  CREATININE -- 0.78 -- -- 0.91 -- -- 1.23*  CKTOTAL -- -- 94 112 -- -- 142 --  CKMB -- -- 2.7 3.0 -- -- 4.0 --  TROPONINI -- -- 1.49* 2.08* -- -- 2.25* --   Estimated Creatinine Clearance: 49.4 ml/min (by C-G formula based on Cr of 0.78).   Medications:  Heparin @ 1000 units/hr  Assessment: 86yof continues on heparin with a therapeutic heparin level. No bleeding noted per chart notes. Cath deferred until tomorrow 2/2 diarrhea.  Goal of Therapy:  Heparin level 0.3-0.5   Plan:  1) Continue heparin @ 1000 units/hr 2) Follow up heparin level in AM 3) Follow up plans for cath  Fredrik Rigger 06/05/2011,6:22 PM

## 2011-06-05 NOTE — Progress Notes (Addendum)
ANTICOAGULATION CONSULT NOTE - Follow Up Consult  Pharmacy Consult for heparin Indication: afib/acs  No Known Allergies  Patient Measurements: Height: 5\' 3"  (160 cm) Weight: 156 lb 8.4 oz (71 kg) IBW/kg (Calculated) : 52.4    Vital Signs: Temp: 98.6 F (37 C) (03/25 0748) Temp src: Oral (03/25 0748) BP: 144/56 mmHg (03/25 0748) Pulse Rate: 53  (03/25 0748)  Labs:  [name] 06/05/11 0508 06/04/11 1111 06/04/11 0420 06/04/11 0222 06/04/11 0219 06/03/11 2010 06/03/11 1631 06/03/11 0730  HGB 10.7* -- -- 10.5* -- -- -- --  HCT 31.7* -- -- 30.8* -- -- -- 33.6*  PLT 179 -- -- 174 -- -- -- 186  APTT -- -- -- -- -- -- -- --  LABPROT -- -- -- -- -- -- -- 14.4  INR -- -- -- -- -- -- -- 1.10  HEPARINUNFRC 0.73* -- -- -- 0.59 -- 0.25* --  CREATININE 0.78 -- -- 0.91 -- -- -- 1.23*  CKTOTAL -- 94 112 -- -- 142 -- --  CKMB -- 2.7 3.0 -- -- 4.0 -- --  TROPONINI -- 1.49* 2.08* -- -- 2.25* -- --   Estimated Creatinine Clearance: 49.4 ml/min (by C-G formula based on Cr of 0.78).  Assessment:  78 yo female with Afib/chest pain for Heparin. Heparin level slightly elevated this AM at 0.73. No bleeding noted.  Platelet count stable.  Goal of Therapy:  Heparin level 0.3-0.7 units/ml   Plan:  Decrease heparin drip to 1050 units/hour. Follow up AM heparin level and CBC. Cardiac cath planned for 3/26.   Celedonio Miyamoto, PharmD, BCPS Clinical Pharmacist Pager [phone]  Addendum:  Dr. Riley Kill decreased heparin level goal to 0.3-0.5.  Will decrease heparin drip further and check a heparin level in 8 hours.  Celedonio Miyamoto, PharmD, BCPS Clinical Pharmacist Pager [phone]

## 2011-06-05 NOTE — Progress Notes (Signed)
06-05-11 UR completed. Izyan Ezzell RN BSN  

## 2011-06-05 NOTE — Progress Notes (Signed)
Pt had one small BM today. BM was watery and missed with a lot of urine; therefore I was unable to obtain sample for stool culture per order.

## 2011-06-05 NOTE — Progress Notes (Addendum)
Subjective:  Still having trouble with diarrhea.  Had two large watery stools last night and feels it.  No chest pain.  Maintaining NSR.    Objective:  Vital Signs in the last 24 hours: Temp:  [98 F (36.7 C)-99.3 F (37.4 C)] 98.6 F (37 C) (03/25 0748) Pulse Rate:  [48-63] 51  (03/25 0800) Resp:  [12-28] 16  (03/25 0800) BP: (122-172)/(40-62) 154/49 mmHg (03/25 0800) SpO2:  [95 %-100 %] 99 % (03/25 0800) Weight:  [156 lb 8.4 oz (71 kg)] 156 lb 8.4 oz (71 kg) (03/25 0300)  Intake/Output from previous day: 03/24 0701 - 03/25 0700 In: 764.5 [P.O.:480; I.V.:284.5] Out: 1250 [Urine:1250]   Physical Exam: General: Well developed, well nourished, in no acute distress. Head:  Normocephalic and atraumatic. Lungs: Clear to auscultation and percussion. Heart: Normal S1 and S2.  No murmur, rubs or gallops. Abd: no masses. BS present.   Pulses: Pulses normal in all 4 extremities. Extremities: No clubbing or cyanosis. No edema. Neurologic: Alert and oriented x 3.    Lab Results:  Basename 06/05/11 0508 06/04/11 0222  WBC 9.7 9.8  HGB 10.7* 10.5*  PLT 179 174    Basename 06/05/11 0508 06/04/11 0222  NA 131* 134*  K 5.1 4.3  CL 103 106  CO2 22 19  GLUCOSE 116* 150*  BUN 12 18  CREATININE 0.78 0.91    Basename 06/04/11 1111 06/04/11 0420  TROPONINI 1.49* 2.08*   Hepatic Function Panel No results found for this basename: PROT,ALBUMIN,AST,ALT,ALKPHOS,BILITOT,BILIDIR,IBILI in the last 72 hours  Basename 06/03/11 0730  CHOL 121   No results found for this basename: PROTIME in the last 72 hours  Imaging: No results found.  EKG:  Non done at [facility].  Will recheck  Cardiac Studies:  Trop trending down.    Assessment/Plan:  Patient Active Hospital Problem List: NSTEMI (non-ST elevated myocardial infarction) (06/02/2011)   Assessment: No chest pain   Plan: currently on heparin and NTG.  Will stop NTG Atrial fibrillation with rapid ventricular response (06/02/2011)  Assessment: resolved   Plan: currently SB.   Dehydration (06/02/2011)   Assessment: improving but still has watery diarrhea.    Plan: she is keeping fluids down at this point.   Hypertension (06/02/2011)   Assessment: controlled    Plan: monitor Hypokalemia (06/03/2011)   Assessment: elevated K today   Plan: hold and dc KCL.   May defer cath another 24 hours.  Will reassess diarrhea in am.  Send stool for culture.  Will also reduce heparin to 0.3-0.5 level because of recent head trauma and stability.         Shawnie Pons, MD, [facility], FSCAI 06/05/2011, 8:44 AM

## 2011-06-06 ENCOUNTER — Encounter (HOSPITAL_COMMUNITY)
Admission: AD | Disposition: A | Discharge status: Home / Self Care | Payer: Self-pay | Source: Other Acute Inpatient Hospital | Attending: Cardiovascular Disease

## 2011-06-06 ENCOUNTER — Encounter (HOSPITAL_COMMUNITY): Payer: Self-pay | Admitting: Internal Medicine

## 2011-06-06 DIAGNOSIS — I2510 Atherosclerotic heart disease of native coronary artery without angina pectoris: Secondary | ICD-10-CM

## 2011-06-06 HISTORY — PX: LEFT HEART CATHETERIZATION WITH CORONARY ANGIOGRAM: SHX5451

## 2011-06-06 LAB — GLUCOSE, CAPILLARY
Glucose-Capillary: 118 mg/dL — ABNORMAL HIGH (ref 70–99)
Glucose-Capillary: 121 mg/dL — ABNORMAL HIGH (ref 70–99)
Glucose-Capillary: 127 mg/dL — ABNORMAL HIGH (ref 70–99)

## 2011-06-06 LAB — HEPARIN LEVEL (UNFRACTIONATED): Heparin Unfractionated: 0.42 IU/mL (ref 0.30–0.70)

## 2011-06-06 LAB — CBC
MCHC: 34.1 g/dL (ref 30.0–36.0)
Platelets: 192 10*3/uL (ref 150–400)
RDW: 13.3 % (ref 11.5–15.5)
WBC: 9 10*3/uL (ref 4.0–10.5)

## 2011-06-06 LAB — BASIC METABOLIC PANEL
BUN: 13 mg/dL (ref 6–23)
Chloride: 101 mEq/L (ref 96–112)
GFR calc Af Amer: 71 mL/min — ABNORMAL LOW (ref >90)
GFR calc non Af Amer: 61 mL/min — ABNORMAL LOW (ref >90)
Potassium: 4.1 mEq/L (ref 3.5–5.1)

## 2011-06-06 SURGERY — LEFT HEART CATHETERIZATION WITH CORONARY ANGIOGRAM
Anesthesia: LOCAL

## 2011-06-06 MED ORDER — SODIUM CHLORIDE 0.9 % IV SOLN
1.0000 mL/kg/h | INTRAVENOUS | Status: DC
  Administered 2011-06-06: 1 mL/kg/h via INTRAVENOUS

## 2011-06-06 MED ORDER — HEPARIN SODIUM (PORCINE) 1000 UNIT/ML IJ SOLN
INTRAMUSCULAR | Status: AC
  Filled 2011-06-06: qty 1

## 2011-06-06 MED ORDER — NITROGLYCERIN 0.2 MG/ML ON CALL CATH LAB
INTRAVENOUS | Status: AC
  Filled 2011-06-06: qty 1

## 2011-06-06 MED ORDER — SODIUM CHLORIDE 0.9 % IJ SOLN
3.0000 mL | Freq: Two times a day (BID) | INTRAMUSCULAR | Status: DC
  Administered 2011-06-06: 3 mL via INTRAVENOUS

## 2011-06-06 MED ORDER — ONDANSETRON HCL 4 MG/2ML IJ SOLN: 4.0000 mg | Freq: Four times a day (QID) | INTRAMUSCULAR | Status: DC | PRN

## 2011-06-06 MED ORDER — SODIUM CHLORIDE 0.9 % IJ SOLN: 3.0000 mL | INTRAMUSCULAR | Status: DC | PRN

## 2011-06-06 MED ORDER — HEPARIN SODIUM (PORCINE) 5000 UNIT/ML IJ SOLN
5000.0000 [IU] | Freq: Three times a day (TID) | INTRAMUSCULAR | Status: DC
  Administered 2011-06-06 – 2011-06-07 (×3): 5000 [IU] via SUBCUTANEOUS
  Filled 2011-06-06 (×6): qty 1

## 2011-06-06 MED ORDER — SODIUM CHLORIDE 0.9 % IV SOLN: 250.0000 mL | INTRAVENOUS | Status: DC | PRN

## 2011-06-06 MED ORDER — HEPARIN (PORCINE) IN NACL 2-0.9 UNIT/ML-% IJ SOLN
INTRAMUSCULAR | Status: AC
  Filled 2011-06-06: qty 2000

## 2011-06-06 MED ORDER — ACETAMINOPHEN 325 MG PO TABS: 650.0000 mg | ORAL_TABLET | ORAL | Status: DC | PRN

## 2011-06-06 MED ORDER — LIDOCAINE HCL (PF) 1 % IJ SOLN
INTRAMUSCULAR | Status: AC
  Filled 2011-06-06: qty 30

## 2011-06-06 MED ORDER — SODIUM CHLORIDE 0.9 % IV SOLN: INTRAVENOUS | Status: AC

## 2011-06-06 NOTE — Op Note (Signed)
Cardiac Cath Procedure Note:  Indication:  NSTEMI  Procedures performed:  1) Selective coronary angiography 2) Left heart catheterization 3) Left ventriculogram  Description of procedure:   The risks and indication of the procedure were explained. Consent was signed and placed on the chart. An appropriate timeout was taken prior to the procedure. After a normal Allen's test was confirmed, the right wrist was prepped and draped in the routine sterile fashion and anesthetized with 1% local lidocaine.   A 5 FR arterial sheath was then placed in the right radial artery using a modified Seldinger technique. Systemic heparin was administered. 3mg  IV verapamil was given through the sheath. Standard catheters including a JL 3.5, JR4 and straight pigtail were used. All catheter exchanges were made over a wire.  Complications:  None apparent  Findings:  Ao Pressure: 115/58 (94) LV Pressure:  168/3/6 There was no signficant gradient across the aortic valve on pullback.  Left main: Mild plaque  LAD: 50-60% prox at take off small D1. Followed by small anuersymal segment. Diffuse 60% through midsection. Moderate diffuse disease distally. D2. Small to moderate sized vessel with diffuse 80% in midsection  LCX: Tiny OM1, large OM-2, tiny OM-3 & 4. 40% prox. OM-2 prox 40%  RCA: Dominant. 30% prox. 50-60% diffuse mid. Diffuse 40% distal into PDA  LV-gram done in the RAO projection: Ejection fraction = 65-7-% normal wall motion  Assessment: 1. Diffuse mostly non-obstructive CAD. No focal culprit lesion 2. Normal EF  Plan/Discussion:  Medical therapy.   Rooney Gladwin 4:07 PM

## 2011-06-06 NOTE — Progress Notes (Signed)
Pt taken to cath lab at 1445hrs via stretcher, family at bedside.  All belongings packed and will be taken to new room once assigned.

## 2011-06-06 NOTE — Interval H&P Note (Signed)
History and Physical Interval Note:  06/06/2011 3:17 PM  [name]  has presented today for surgery, with the diagnosis of NSTEMI  The various methods of treatment have been discussed with the patient and family. After consideration of risks, benefits and other options for treatment, the patient has consented to  Procedure(s) (LRB): LEFT HEART CATHETERIZATION WITH CORONARY ANGIOGRAM (N/A) and POSSIBLE CORONARY ANGIOPLASTY as a surgical intervention .  The patients' history has been reviewed, patient examined, no change in status, stable for surgery.  I have reviewed the patients' chart and labs.  Questions were answered to the patient's satisfaction.     Maccoy Haubner

## 2011-06-06 NOTE — H&P (View-Only) (Signed)
Subjective:  Feels good.  Diarrhea improved.  No more loose stools.  Denies chest pain.  I spoke with four of her daughters today.  The patient would like to proceed with cath study today.  She is agreeable and the risks have been reviewed.    Objective:  Vital Signs in the last 24 hours: Temp:  [97.4 F (36.3 C)-98.3 F (36.8 C)] 97.8 F (36.6 C) (03/26 0737) Pulse Rate:  [45-59] 47  (03/26 0737) Resp:  [11-26] 22  (03/26 0737) BP: (121-171)/(37-63) 145/56 mmHg (03/26 0737) SpO2:  [97 %-100 %] 98 % (03/26 0737)  Intake/Output from previous day: 03/25 0701 - 03/26 0700 In: 724.8 [P.O.:480; I.V.:244.8] Out: 2050 [Urine:2050]   Physical Exam: General: Well developed, well nourished, in no acute distress. Head:  Normocephalic and atraumatic. Lungs: Clear to auscultation and percussion. Heart: Normal S1 and S2.  No murmur, rubs or gallops.  Neurologic: Alert and oriented x 3.    Lab Results:  Basename 06/06/11 0404 06/05/11 0508  WBC 9.0 9.7  HGB 11.3* 10.7*  PLT 192 179    Basename 06/06/11 0404 06/05/11 0508  NA 134* 131*  K 4.1 5.1  CL 101 103  CO2 24 22  GLUCOSE 115* 116*  BUN 13 12  CREATININE 0.85 0.78    Basename 06/04/11 1111 06/04/11 0420  TROPONINI 1.49* 2.08*   Hepatic Function Panel No results found for this basename: PROT,ALBUMIN,AST,ALT,ALKPHOS,BILITOT,BILIDIR,IBILI in the last 72 hours No results found for this basename: CHOL in the last 72 hours No results found for this basename: PROTIME in the last 72 hours  Imaging: No results found.    Assessment/Plan:  Patient Active Hospital Problem List: NSTEMI (non-ST elevated myocardial infarction) (06/02/2011)   Assessment: pos enzymes   Plan: for cath study today.  Liquid breakfast.  Risks reviewed.  Atrial fibrillation with rapid ventricular response (06/02/2011)   Assessment: Maintaining NSR   Plan: observe Dehydration (06/02/2011)   Assessment: resolved   Plan: continue fluids Hypertension  (06/02/2011)   Assessment: stable    Plan: monitor Hypokalemia (06/03/2011)   Assessment: resolved   Plan: monitor  She wishes to proceed with cath today.  Alert and oriented.  Risks reviewed.       Shawnie Pons, MD, [facility], FSCAI 06/06/2011, 8:57 AM

## 2011-06-06 NOTE — Progress Notes (Signed)
ANTICOAGULATION CONSULT NOTE - Follow Up Consult  Pharmacy Consult for heparin Indication: afib/acs  No Known Allergies  Patient Measurements: Height: 5\' 3"  (160 cm) Weight: 156 lb 8.4 oz (71 kg) IBW/kg (Calculated) : 52.4    Vital Signs: Temp: 97.4 F (36.3 C) (03/26 0405) Temp src: Oral (03/26 0405) BP: 141/55 mmHg (03/26 0405) Pulse Rate: 45  (03/26 0405)  Labs:  Basename 06/06/11 0404 06/05/11 1726 06/05/11 0508 06/04/11 1111 06/04/11 0420 06/04/11 0222 06/03/11 2010  HGB 11.3* -- 10.7* -- -- -- --  HCT 33.1* -- 31.7* -- -- 30.8* --  PLT 192 -- 179 -- -- 174 --  APTT -- -- -- -- -- -- --  LABPROT -- -- -- -- -- -- --  INR -- -- -- -- -- -- --  HEPARINUNFRC 0.42 0.47 0.73* -- -- -- --  CREATININE 0.85 -- 0.78 -- -- 0.91 --  CKTOTAL -- -- -- 94 112 -- 142  CKMB -- -- -- 2.7 3.0 -- 4.0  TROPONINI -- -- -- 1.49* 2.08* -- 2.25*   Estimated Creatinine Clearance: 46.5 ml/min (by C-G formula based on Cr of 0.85).  Assessment:  78 yo female with Afib/chest pain for Heparin. Heparin level therapeutic this AM at 0.42. No bleeding noted.  Platelet count stable.  Goal of Therapy:  Heparin level 0.3-0.5 units/ml   Plan:  Continue heparin drip at the same rate. Daily heparin level and CBC while on heparin. Cardiac cath planned for today.   Celedonio Miyamoto, PharmD, BCPS Clinical Pharmacist Pager [phone]

## 2011-06-06 NOTE — Progress Notes (Addendum)
Subjective:  Feels good.  Diarrhea improved.  No more loose stools.  Denies chest pain.  I spoke with four of her daughters today.  The patient would like to proceed with cath study today.  She is agreeable and the risks have been reviewed.    Objective:  Vital Signs in the last 24 hours: Temp:  [97.4 F (36.3 C)-98.3 F (36.8 C)] 97.8 F (36.6 C) (03/26 0737) Pulse Rate:  [45-59] 47  (03/26 0737) Resp:  [11-26] 22  (03/26 0737) BP: (121-171)/(37-63) 145/56 mmHg (03/26 0737) SpO2:  [97 %-100 %] 98 % (03/26 0737)  Intake/Output from previous day: 03/25 0701 - 03/26 0700 In: 724.8 [P.O.:480; I.V.:244.8] Out: 2050 [Urine:2050]   Physical Exam: General: Well developed, well nourished, in no acute distress. Head:  Normocephalic and atraumatic. Lungs: Clear to auscultation and percussion. Heart: Normal S1 and S2.  No murmur, rubs or gallops.  Neurologic: Alert and oriented x 3.    Lab Results:  Basename 06/06/11 0404 06/05/11 0508  WBC 9.0 9.7  HGB 11.3* 10.7*  PLT 192 179    Basename 06/06/11 0404 06/05/11 0508  NA 134* 131*  K 4.1 5.1  CL 101 103  CO2 24 22  GLUCOSE 115* 116*  BUN 13 12  CREATININE 0.85 0.78    Basename 06/04/11 1111 06/04/11 0420  TROPONINI 1.49* 2.08*   Hepatic Function Panel No results found for this basename: PROT,ALBUMIN,AST,ALT,ALKPHOS,BILITOT,BILIDIR,IBILI in the last 72 hours No results found for this basename: CHOL in the last 72 hours No results found for this basename: PROTIME in the last 72 hours  Imaging: No results found.    Assessment/Plan:  Patient Active Hospital Problem List: NSTEMI (non-ST elevated myocardial infarction) (06/02/2011)   Assessment: pos enzymes   Plan: for cath study today.  Liquid breakfast.  Risks reviewed.  Atrial fibrillation with rapid ventricular response (06/02/2011)   Assessment: Maintaining NSR   Plan: observe Dehydration (06/02/2011)   Assessment: resolved   Plan: continue fluids Hypertension  (06/02/2011)   Assessment: stable    Plan: monitor Hypokalemia (06/03/2011)   Assessment: resolved   Plan: monitor  She wishes to proceed with cath today.  Alert and oriented.  Risks reviewed.       Rhayne Chatwin, MD, FACC, FSCAI 06/06/2011, 8:57 AM    

## 2011-06-07 ENCOUNTER — Encounter (HOSPITAL_COMMUNITY): Payer: Self-pay | Admitting: Physician Assistant

## 2011-06-07 DIAGNOSIS — I495 Sick sinus syndrome: Secondary | ICD-10-CM

## 2011-06-07 DIAGNOSIS — I2510 Atherosclerotic heart disease of native coronary artery without angina pectoris: Secondary | ICD-10-CM

## 2011-06-07 LAB — CBC
HCT: 33.2 % — ABNORMAL LOW (ref 36.0–46.0)
Hemoglobin: 11.3 g/dL — ABNORMAL LOW (ref 12.0–15.0)
MCHC: 34 g/dL (ref 30.0–36.0)
MCV: 85.8 fL (ref 78.0–100.0)

## 2011-06-07 LAB — BASIC METABOLIC PANEL
BUN: 14 mg/dL (ref 6–23)
Creatinine, Ser: 0.77 mg/dL (ref 0.50–1.10)
GFR calc non Af Amer: 75 mL/min — ABNORMAL LOW (ref >90)
Glucose, Bld: 103 mg/dL — ABNORMAL HIGH (ref 70–99)
Potassium: 4.1 mEq/L (ref 3.5–5.1)

## 2011-06-07 LAB — GLUCOSE, CAPILLARY
Glucose-Capillary: 102 mg/dL — ABNORMAL HIGH (ref 70–99)
Glucose-Capillary: 95 mg/dL (ref 70–99)

## 2011-06-07 MED ORDER — HEART ATTACK BOUNCING BOOK
Freq: Once | Status: AC
  Administered 2011-06-07: 15:00:00
  Filled 2011-06-07: qty 1

## 2011-06-07 MED ORDER — METOPROLOL TARTRATE 25 MG/10 ML ORAL SUSPENSION
6.1250 mg | Freq: Two times a day (BID) | ORAL | Status: DC
  Administered 2011-06-07 – 2011-06-08 (×2): 6.25 mg via ORAL
  Filled 2011-06-07 (×4): qty 2.5

## 2011-06-07 NOTE — Progress Notes (Signed)
CARDIAC REHAB PHASE I   PRE:  Rate/Rhythm: 49SB  BP:  Supine: 155/58  Sitting:   Standing:    SaO2:   MODE:  Ambulation: 450 ft   POST:  Rate/Rhythem: 76SR  BP:  Supine:   Sitting: 146/54  Standing:    SaO2:  0740-0840 Pt walked 450 ft on RA with rolling walker and asst x 1. States she has walker at home. Pt did c/o generalized weakness during walk but tolerated well with assistance. Encouraged her to have daughter assist her when she needs to be up. Denied chest pain. To recliner with call bell. Education completed. Declined CRP 2 due to no transportation.  Duanne Limerick

## 2011-06-07 NOTE — Progress Notes (Signed)
Patient Name: [name] Date of Encounter: 06/07/2011    Principal Problem:  *NSTEMI (non-ST elevated myocardial infarction) Active Problems:  Atrial fibrillation with rapid ventricular response  Dehydration  Hypertension  Hypokalemia  Sinus bradycardia  Diastolic CHF, chronic    SUBJECTIVE: Denies cp, sob, lightheadedness, palpitations, n/v/d. Does endorse fatigue which she attributes to recent GI illness.   OBJECTIVE  Filed Vitals:   06/07/11 0006 06/07/11 0407 06/07/11 0700 06/07/11 0900  BP: 143/53 151/41 155/58 146/54  Pulse: 57 53 61 61  Temp: 98.8 F (37.1 C) 98.1 F (36.7 C) 98 F (36.7 C)   TempSrc: Oral Oral Oral   Resp: 20 18 18    Height:      Weight: 64.4 kg (141 lb 15.6 oz)     SpO2: 98% 98% 98%     Intake/Output Summary (Last 24 hours) at 06/07/11 1202 Last data filed at 06/07/11 0900  Gross per 24 hour  Intake 1291.72 ml  Output   1550 ml  Net -258.28 ml   Weight change:   PHYSICAL EXAM  General: Elderly, well nourished, in no acute distress. Head: Normocephalic, ecchymosis to R inferior orbit and noted diffusely superolaterally, sclera non-icteric, no xanthomas Neck: Supple without bruits or JVD. Lungs:  Resp regular and unlabored, CTAB without wheezes, rales or rhonchi Heart: Bradycardic, no s3, s4, or murmurs. Abdomen: Soft, non-tender, non-distended, BS + x 4.  Msk:  Strength and tone appears normal for age. Extremities: R wrist without ecchymosis, discharge, erythema or induration. No clubbing, cyanosis or edema. DP/PT/Radials 2+ and equal bilaterally. Neuro: Alert and oriented X 3. Moves all extremities spontaneously. No focal deficits noted.  Psych: Normal affect.  LABS:  Recent Labs  [facility] 06/07/11 0420 06/06/11 0404   WBC 10.0 9.0   HGB 11.3* 11.3*   HCT 33.2* 33.1*   MCV 85.8 85.5   PLT 170 192    Lab 06/07/11 0420 06/06/11 0404 06/05/11 0508  NA 132* 134* 131*  K 4.1 4.1 5.1  CL 102 101 103  CO2 20 24 22     BUN 14 13 12   CREATININE 0.77 0.85 0.78  CALCIUM 9.1 9.0 8.7  PROT -- -- --  BILITOT -- -- --  ALKPHOS -- -- --  ALT -- -- --  AST -- -- --  AMYLASE -- -- --  LIPASE -- -- --  GLUCOSE 103* 115* 116*    No results found for this basename: CKTOTAL:3,CKMB:3,CKMBINDEX:3,TROPONINI:3 in the last 72 hours  TELE: sinus bradycardia, 30-40 bpm overnight; 40-50 bpm currently  ECG: no new tracings  Radiology/Studies:  No results found.  Current Medications:     . ALPRAZolam  0.25 mg Oral QHS  . aspirin EC  81 mg Oral Daily  . heart attack bouncing book   Does not apply Once  . heparin      . heparin      . heparin  5,000 Units Subcutaneous Q8H  . hydrALAZINE  10 mg Oral TID  . lidocaine      . metoprolol tartrate  6.25 mg Oral BID  . nitroGLYCERIN      . simvastatin  20 mg Oral QPM  . DISCONTD: metoprolol tartrate  12.5 mg Oral BID  . DISCONTD: sodium chloride  3 mL Intravenous Q12H    ASSESSMENT AND PLAN:  1. CAD/NSTEMI- likely demand ischemia secondary to a fib with RVR. Asymptomatic this AM and overnight. Ambulated in the halls well without incident. Cardiac cath on 03/26 revealed diffuse  mostly nonobstructive CAD noted without identification of a focal lesion. LVEF 65-70%. The recommendation was made to continue medical therapy.   - Continue ASA, BB, statin, NTG SL PRN  2. Atrial fibrillation with RVR- paroxysmal likely in the setting of acute GI illness with resultant n/v/d, weakness and profound dehydration. This was noted on admission with spontaneous conversion. She was placed on low-dose Lopressor to prevent further paroxysms. It was noted on the H&P that she has not tolerated this medication secondary to bradycardia.   - Continue adjusted heparin dosing due to recent head trauma  3. Sinus bradycardia- noted overnight and this AM on telemetry. This has also been noted post-conversion from #2 with also 2.06s pause. Patient is asymptomatic with this. Daughter tells me  that her mother has been on Lopressor in the past with HR in the 30s. It has subsequently been discontinued and the patient has not been on it for about 9-10 months.   - Will decrease Lopressor to 6.125mg  BID  - Will continue to monitor  4. Chronic diastolic CHF- compensated; noted on 2D echo 06/03/11; euvolemic on exam.   - will continue to monitor  5. Hypertension- would benefit from tighter control  - renal function back to baseline after hydration, have discussed resuming outpatient ACEi/HCTZ, MD to review later   6. Hyperlipidemia    - continue Zocor  7. Dehydration- secondary to likely viral gastroenteritis with associated n/v/d and weakness improved. Patient slowly tolerating PO. Close to resolution. Patient's daughter tells me that the patient lives with her and that this illness is still going around her home. Will likely keep inpatient for at least 1 additional day. Encouraged to increase PO intake.    Signed, R. Hurman Horn, PA-C 06/07/2011, 12:02 PM  Patient seen and examined, and agree with Mr. Lorre Nick.   She is much improved, and should be ready for dc in the am.  She does have bradycardia, and this is as noted associated with her metoprolol.  We will lower the dose to the lowest possible dose, and this may need to be stopped.  She should not drive until she is seen back in follow up.    Will continue to monitor until am.   ]

## 2011-06-08 LAB — BASIC METABOLIC PANEL
BUN: 16 mg/dL (ref 6–23)
Calcium: 9.2 mg/dL (ref 8.4–10.5)
GFR calc Af Amer: 67 mL/min — ABNORMAL LOW (ref >90)
GFR calc non Af Amer: 58 mL/min — ABNORMAL LOW (ref >90)
Glucose, Bld: 137 mg/dL — ABNORMAL HIGH (ref 70–99)
Potassium: 4 mEq/L (ref 3.5–5.1)
Sodium: 137 mEq/L (ref 135–145)

## 2011-06-08 LAB — CBC
MCH: 29.5 pg (ref 26.0–34.0)
MCHC: 34.5 g/dL (ref 30.0–36.0)
RDW: 13.1 % (ref 11.5–15.5)

## 2011-06-08 MED ORDER — METOPROLOL TARTRATE 25 MG/10 ML ORAL SUSPENSION: 6.1250 mg | Freq: Two times a day (BID) | ORAL | Status: DC

## 2011-06-08 MED ORDER — ASPIRIN 81 MG PO TBEC: 81.0000 mg | DELAYED_RELEASE_TABLET | Freq: Every day | ORAL | Status: DC

## 2011-06-08 MED ORDER — CLOPIDOGREL BISULFATE 75 MG PO TABS
300.0000 mg | ORAL_TABLET | Freq: Once | ORAL | Status: AC
  Administered 2011-06-08: 300 mg via ORAL
  Filled 2011-06-08 (×2): qty 2

## 2011-06-08 MED ORDER — NITROGLYCERIN 0.4 MG SL SUBL: 0.4000 mg | SUBLINGUAL_TABLET | SUBLINGUAL | Status: DC | PRN

## 2011-06-08 NOTE — Progress Notes (Signed)
CARDIAC REHAB PHASE I   PRE:  Rate/Rhythm: 69SR  BP:  Supine:   Sitting: 144/62  Standing:    SaO2:   MODE:  Ambulation: 480 ft   POST:  Rate/Rhythem: 96SR  BP:  Supine:   Sitting: 145/56  Standing:    SaO2: 99%RA 0906-0930 Pt walked 480 ft with rolling walker and asst x 1. Gait slow and steady. No c/o CP. To sitting on side of bed after walk.  [name]

## 2011-06-08 NOTE — Progress Notes (Addendum)
Subjective:  Ambulating in hall.  HR is up some.    Objective:  Vital Signs in the last 24 hours: Temp:  [98.2 F (36.8 C)-99.1 F (37.3 C)] 99.1 F (37.3 C) (03/28 0409) Pulse Rate:  [55-59] 55  (03/28 0409) Resp:  [16-20] 17  (03/28 0409) BP: (130-159)/(51-67) 141/62 mmHg (03/28 0409) SpO2:  [94 %-97 %] 94 % (03/28 0409) Weight:  [139 lb 15.9 oz (63.5 kg)] 139 lb 15.9 oz (63.5 kg) (03/28 0008)  Intake/Output from previous day: 03/27 0701 - 03/28 0700 In: 720 [P.O.:720] Out: 1500 [Urine:1500]   Physical Exam: General: Thin older female in no distress. Just ambulated without difficulty Head:  Normocephalic and atraumatic.  Bruise under eye.  Lungs: Clear to auscultation and percussion. Heart: Normal S1 and S2.  No murmur, rubs or gallops.  Pulses: Pulses normal in all 4 extremities. Extremities: No clubbing or cyanosis. No edema. Neurologic: Alert and oriented x 3.    Lab Results:  Basename 06/08/11 0405 06/07/11 0420  WBC 7.8 10.0  HGB 11.3* 11.3*  PLT 224 170    Basename 06/08/11 0405 06/07/11 0420  NA 137 132*  K 4.0 4.1  CL 104 102  CO2 22 20  GLUCOSE 137* 103*  BUN 16 14  CREATININE 0.89 0.77   No results found for this basename: TROPONINI:2,CK,MB:2 in the last 72 hours Hepatic Function Panel No results found for this basename: PROT,ALBUMIN,AST,ALT,ALKPHOS,BILITOT,BILIDIR,IBILI in the last 72 hours No results found for this basename: CHOL in the last 72 hours No results found for this basename: PROTIME in the last 72 hours  Imaging: No results found.    Assessment/Plan:  Patient Active Hospital Problem List: NSTEMI (non-ST elevated myocardial infarction) (06/02/2011)   Assessment: stable.  No pain   Plan: continue ASA.  Consider plavix (will hold for now) Atrial fibrillation with rapid ventricular response (06/02/2011)   Assessment: resolved.  Occurred in setting of acute illness   Plan: defer decision on anticoagulation until seen  back Dehydration (06/02/2011)   Assessment: resolved   Plan: defer diuretic/ACe until follow up., Hypertension (06/02/2011)   Assessment: continue for now with low dose beta blocker and hydralazine.    Plan: early follow up for reassessment of BP.  Cope to Dr. Leandrew Koyanagi. Early follow up in [place].  Less than thirty minutes.           Shawnie Pons, MD, [facility], FSCAI 06/08/2011, 9:49 AM

## 2011-06-08 NOTE — Discharge Instructions (Signed)
PLEASE REMEMBER TO BRING ALL OF YOUR MEDICATIONS TO EACH OF YOUR FOLLOW-UP OFFICE VISITS.  PLEASE ATTEND ALL FOLLOW-UP VISIT AS SCHEDULED. YOU MAY RE-SCHEDULE IF THE APPOINTMENT DATES/TIMES CONFLICT WITH YOUR SCHEDULE.  Activity: Increase activity slowly as tolerated. You may shower, but no soaking baths for 5 days. No driving for 1 day. No lifting over 5 lbs for 5 days. No sexual activity for 5 days.   You May Return to Work: in 5 days (if applicable)  Wound Care: You may wash cath site gently with soap and water. Keep cath site clean and dry. If you notice pain, swelling, bleeding or pus at your cath site, please call [phone].

## 2011-06-08 NOTE — Discharge Summary (Signed)
Discharge Summary   Patient ID: [name],  MRN: [id number], DOB/AGE: 08/05/1924 78 y.o.  Admit date: 06/02/2011 Discharge date: 06/08/2011  Discharge Diagnoses Principal Problem:  *NSTEMI (non-ST elevated myocardial infarction) Active Problems:  Atrial fibrillation with rapid ventricular response  Dehydration  Hypertension  Hypokalemia  Sinus bradycardia  Diastolic CHF, chronic   Allergies No Known Allergies  Diagnostic Studies/Procedures  2D echocardiogram with contrast- 06/03/11  Study Conclusions  - Left ventricle: The cavity size was normal. Wall thickness was normal. Systolic function was normal. The estimated ejection fraction was in the range of 55% to 60%. Features are consistent with a pseudonormal left ventricular filling pattern, with concomitant abnormal relaxation and increased filling pressure (grade 2 diastolic dysfunction). Doppler parameters are consistent with elevated mean left atrial filling pressure. - Aortic valve: Trivial regurgitation. - Mitral valve: Calcified annulus. Mildly calcified leaflets . Mild regurgitation. - Left atrium: The atrium was mildly to moderately dilated. - Atrial septum: No defect or patent foramen ovale was identified. - Pulmonary arteries: PA peak pressure: 44mm Hg (S). Transthoracic echocardiography. M-mode, complete 2D, spectral Doppler, and color Doppler. Height: Height: 160cm. Height: 63in. Weight: Weight: 70.4kg. Weight: 154.9lb. Body mass index: BMI: 27.5kg/m^2. Body surface area: BSA: 1.75m^2. Blood pressure: 178/59. Patient status: Inpatient. Location: ICU/CCU  ------------------------------------------------------------  ------------------------------------------------------------ Left ventricle: The cavity size was normal. Wall thickness was normal. Systolic function was normal. The estimated ejection fraction was in the range of 55% to 60%. Features are consistent with a pseudonormal left  ventricular filling pattern, with concomitant abnormal relaxation and increased filling pressure (grade 2 diastolic dysfunction). Doppler parameters are consistent with elevated mean left atrial filling pressure.  ------------------------------------------------------------ Aortic valve: Mildly calcified leaflets. Doppler: Trivial regurgitation.  ------------------------------------------------------------ Mitral valve: Calcified annulus. Mildly calcified leaflets . Doppler: Mild regurgitation. Peak gradient: 4mm Hg (D).  ------------------------------------------------------------ Left atrium: The atrium was mildly to moderately dilated.  ------------------------------------------------------------ Atrial septum: No defect or patent foramen ovale was identified.  ------------------------------------------------------------ Pulmonic valve: The valve appears to be grossly normal. Doppler: No significant regurgitation.  ------------------------------------------------------------ Tricuspid valve: Doppler: Mild regurgitation.  ------------------------------------------------------------ Pulmonary artery: Poorly visualized. The main pulmonary artery was normal-sized.  ------------------------------------------------------------ Right atrium: The atrium was at the upper limits of normal in size.  ------------------------------------------------------------ Pericardium: There was no pericardial effusion.  ------------------------------------------------------------ Systemic veins: Inferior vena cava: The vessel was normal in size; the respirophasic diameter changes were in the normal range (= 50%); findings are consistent with normal central venous pressure.  ------------------------------------------------------------  2D measurements Normal Doppler Normal Left ventricle measurements LVID ED, 46.4 mm 43-52 Main pulmonary chord, artery PLAX Pressure, S 44 mm =30 LVID  ES, 27 mm 23-38 Hg chord, Left ventricle PLAX Ea, lat 8.01 cm/ ------- FS, chord, 42 % >29 ann, tiss s PLAX DP LVPW, ED 11 mm ------ E/Ea, lat 12.48 ------- IVS/LVPW 1.08 <1.3 ann, tiss ratio, ED DP Ventricular septum Ea, med 5.81 cm/ ------- IVS, ED 11.9 mm ------ ann, tiss s Aorta DP Root diam, 31 mm ------ E/Ea, med 17.21 ------- ED ann, tiss Left atrium DP AP dim 41 mm ------ Mitral valve AP dim 2.29 cm/m^2 <2.2 Peak E vel 100 cm/ ------- index s Peak A vel 63.2 cm/ ------- s Deceleratio 239 ms 150-230 n time Peak 4 mm ------- gradient, D Hg Peak E/A 1.6 ------- ratio Tricuspid valve Regurg peak 311 cm/ ------- vel s Peak RV-RA 39 mm ------- gradient, S Hg Systemic veins Estimated 5 mm ------- CVP Hg Right ventricle Pressure,  S 44 mm <30 Hg Sa vel, lat 14.4 cm/ ------- ann, tiss s DP  Cardiac catheterization- 06/06/11  Indication: NSTEMI  Procedures performed:  1) Selective coronary angiography  2) Left heart catheterization  3) Left ventriculogram  Description of procedure:  The risks and indication of the procedure were explained. Consent was signed and placed on the chart. An appropriate timeout was taken prior to the procedure. After a normal Allen's test was confirmed, the right wrist was prepped and draped in the routine sterile fashion and anesthetized with 1% local lidocaine.  A 5 FR arterial sheath was then placed in the right radial artery using a modified Seldinger technique. Systemic heparin was administered. 3mg  IV verapamil was given through the sheath. Standard catheters including a JL 3.5, JR4 and straight pigtail were used. All catheter exchanges were made over a wire.  Complications: None apparent Findings:  Ao Pressure: 115/58 (94)  LV Pressure: 168/3/6  There was no signficant gradient across the aortic valve on pullback.  Left main: Mild plaque  LAD: 50-60% prox at take off small D1. Followed by small anuersymal segment. Diffuse 60%  through midsection. Moderate diffuse disease distally. D2. Small to moderate sized vessel with diffuse 80% in midsection  LCX: Tiny OM1, large OM-2, tiny OM-3 & 4. 40% prox. OM-2 prox 40%  RCA: Dominant. 30% prox. 50-60% diffuse mid. Diffuse 40% distal into PDA  LV-gram done in the RAO projection: Ejection fraction = 65-7-% normal wall motion  Assessment:  1. Diffuse mostly non-obstructive CAD. No focal culprit lesion  2. Normal EF  Plan/Discussion:  Medical therapy.   History of Present Illness  Ms. Kloth is a 78yo female with PMHx significant for HTN, HL and carotid artery disease who was admitted to [facility] with atrial fibrillation with RVR with troponin elevation.   She reported a several-day history of nausea, vomiting and diarrhea which she attributed to a "stomach virus going around the house." She endorsed associated decreased PO intake. She reported multiple episodes of diarrhea and vomiting daily. The night prior to admission, she felt weak, and fell upon arising out of bed. She denied hitting her head or losing consciousness. She was still weak after falling and suddenly developed SSCP lasting 5-10 minutes. She attributed the pain to the fall. She denied SOB, palpitations or lightheadedness. She did endorse marked diaphoresis. Prior to her recent fatigue and malaise, she denied episodes of chest pain, sob or DOE, PND, orthopnea or LE edema. She endorsed chronic leg and back pain.   Upon arrival to [facility] ED, EKG revealed atrial fibrillation + RVR with spontaneous conversion to NSR. No ischemic changes. Initial TnI outside the hospital was elevated. She was admitted and underwent IVF hydration and electrolyte repletion. She was started on low-dose BB due to history of bradycardia with beta blockade. ACEi/HCTZ discontinued in the setting of dehydration and acute renal failure. Given her elevation of cardiac biomarkers, she was transferred to [facility] hospital for cardiac  catheterization.  Hospital Course   She was transferred without incident. She was started on heparin. N/v/d persisted, however improved throughout the weekend. She underwent 2D echocardiogram described above. Her cardiac cath was deferred until her diarrhea improved further. In the meantime, there were no acute cardiac events.    She was informed, consented and underwent cardiac catheterization which is outlined above. She tolerated the procedure well without complications. The recommendation was made to continue medical therapy. She was transferred to CRU.   There were no acute events following  cardiac catheterization. Today, she is stable, in good condition and will be discharged home. Of note, she had a large ecchymosis surrounding her R orbit which spread after falling prior to her admission to [facility]. On the date of discharge, she was given a loading dose of Plavix in error. There were no complications from this and was not resumed, but a note was made by Dr. Riley Kill to continue this in the future. The patient will continue ASA, BB, hydralazine, NTG SL PRN. She will continue all other outpatient meds. Anticoagulation and re-initiation of prior antihypertensives including ACEi/HCTZ will be deferred until early follow-up next week with her PCP and [facility] for BP check. These appointments have been scheduled for her. This information, including activity restrictions has been clearly outlined in the discharge AVS.   Discharge Vitals:  Blood pressure 145/56, pulse 65, temperature 99.1 F (37.3 C), temperature source Oral, resp. rate 17, height 5\' 3"  (1.6 m), weight 63.5 kg (139 lb 15.9 oz), SpO2 94.00%.   Weight change: -7.5 kg (-16 lb 8.6 oz)  Labs: Recent Labs  [facility] 06/08/11 0405 06/07/11 0420   WBC 7.8 10.0   HGB 11.3* 11.3*   HCT 32.8* 33.2*   MCV 85.6 85.8   PLT 224 170    Lab 06/08/11 0405 06/07/11 0420 06/06/11 0404  NA 137 132* 134*  K 4.0 4.1 4.1    CL 104 102 101  CO2 22 20 24   BUN 16 14 13   CREATININE 0.89 0.77 0.85  CALCIUM 9.2 9.1 9.0  PROT -- -- --  BILITOT -- -- --  ALKPHOS -- -- --  ALT -- -- --  AST -- -- --  AMYLASE -- -- --  LIPASE -- -- --  GLUCOSE 137* 103* 115*   Disposition:   Follow-up Information    Follow up with [facility]. (Please follow-up in less than 1 week. The office will call you with an appointment date and time. )    Contact information:   [address] [place] [address]       Follow up on 06/13/2011. (With Dr. Murvin Natal at 10:45 AM for follow-up after this hospitalization. )         Discharge Medications:  Medication List  As of 06/08/2011 11:15 AM   START taking these medications         metoprolol tartrate 25 mg/10 mL Susp   Commonly known as: LOPRESSOR   Take 2.5 mLs (6.25 mg total) by mouth 2 (two) times daily.      nitroGLYCERIN 0.4 MG SL tablet   Commonly known as: NITROSTAT   Place 1 tablet (0.4 mg total) under the tongue every 5 (five) minutes x 3 doses as needed for chest pain.         CHANGE how you take these medications         aspirin 81 MG EC tablet   Take 1 tablet (81 mg total) by mouth daily.   What changed: - medication strength - dose - how often to take the med         CONTINUE taking these medications         ALPRAZolam 0.25 MG tablet   Commonly known as: XANAX      hydrALAZINE 10 MG tablet   Commonly known as: APRESOLINE      ondansetron 8 MG tablet   Commonly known as: ZOFRAN      simvastatin 20 MG  tablet   Commonly known as: ZOCOR         STOP taking these medications         hydrochlorothiazide 12.5 MG capsule      lisinopril 10 MG tablet          Where to get your medications    These are the prescriptions that you need to pick up. We sent them to a specific pharmacy, so you will need to go there to get them.   [facility] - [place], [place] - [address]    [address] [place] [address]     Phone: [phone]        metoprolol tartrate 25 mg/10 mL Susp   nitroGLYCERIN 0.4 MG SL tablet         Information on where to get these meds is not yet available. Ask your nurse or doctor.         aspirin 81 MG EC tablet           Outstanding Labs/Studies: None  Duration of Discharge Encounter: Greater than 30 minutes including physician time.  Signed, R. Hurman Horn, PA-C 06/08/2011, 11:15 AM

## 2011-06-15 ENCOUNTER — Encounter: Payer: Self-pay | Admitting: Physician Assistant

## 2011-06-15 ENCOUNTER — Ambulatory Visit (INDEPENDENT_AMBULATORY_CARE_PROVIDER_SITE_OTHER): Payer: Medicare Other | Admitting: Physician Assistant

## 2011-06-15 VITALS — BP 142/73 | HR 61 | Ht 63.0 in | Wt 140.5 lb

## 2011-06-15 DIAGNOSIS — E785 Hyperlipidemia, unspecified: Secondary | ICD-10-CM

## 2011-06-15 DIAGNOSIS — I4891 Unspecified atrial fibrillation: Secondary | ICD-10-CM

## 2011-06-15 DIAGNOSIS — I214 Non-ST elevation (NSTEMI) myocardial infarction: Secondary | ICD-10-CM

## 2011-06-15 DIAGNOSIS — I10 Essential (primary) hypertension: Secondary | ICD-10-CM

## 2011-06-15 NOTE — Progress Notes (Signed)
HPI: Patient presents to our office for the first time, status post hospital followup. She initially presented to the [facility] ED, status post fall/weakness, and found to be in new onset PAF RVR at approximately 100 bpm. She subsequently converted to NSR, prior to transfer to [facility]. She also had abnormal troponin 5.3. Of note, patient presented with several day history of nausea, vomiting, and diarrhea. Her fall was not associated with frank syncope, according to her daughter.  Subsequent workup notable for normal LVF by echocardiography (EF 55-60%), with grade 2 diastolic dysfunction; mild MR; mild/moderate LAE, and elevated mean LA filling pressure. Cardiac catheterization yielded diffuse, mostly nonobstructive CAD; EF 65-70%. Medical therapy was recommended.  Prior to transfer, she was taken off ACE inhibitor/HCTZ, secondary to acute renal failure (creatinine 1.5) Recent followup labs notable for BUN 14, creatinine 0.95, potassium 4.2.  Clinically, patient feels "much better". She denies any CP, SOB, or palpitations. Of note, regarding the latter the daughter states that the patient has never felt these, not even during her recent presentation. She also has not had any recurrent falls.  Patient's daughter states that the patient was treated for "mini strokes", approximately one year ago. She otherwise presented with no prior history of heart disease, or documented dysrhythmia. She does have history of HTN and borderline diabetes.  No Known Allergies  Current Outpatient Prescriptions  Medication Sig Dispense Refill  . ALPRAZolam (XANAX) 0.25 MG tablet Take 0.125-0.25 mg by mouth at bedtime as needed. Take 0.5 tablets (0.125 MG) twice daily, and take 1 tablet (0.25 MG) at bedtime.      Marland Kitchen aspirin EC 81 MG EC tablet Take 1 tablet (81 mg total) by mouth daily.      . hydrALAZINE (APRESOLINE) 10 MG tablet Take 10 mg by mouth 3 (three) times daily.      . metoprolol tartrate (LOPRESSOR) 25 MG tablet Take  6.25 mg by mouth 2 (two) times daily. (Pharmacy is cutting tablet into quarters.)      . nitroGLYCERIN (NITROSTAT) 0.4 MG SL tablet Place 1 tablet (0.4 mg total) under the tongue every 5 (five) minutes x 3 doses as needed for chest pain.  25 tablet  3  . ondansetron (ZOFRAN) 8 MG tablet Take 8 mg by mouth every 8 (eight) hours as needed. For nausea.      . simvastatin (ZOCOR) 20 MG tablet Take 20 mg by mouth every evening.        Past Medical History  Diagnosis Date  . Hypertension   . Diabetes mellitus     borderline  . Stroke     mini stroke  . Anxiety   . Paroxysmal atrial fibrillation   . Diastolic CHF, chronic     Past Surgical History  Procedure Date  . Back surgery   . Cholecystectomy   . Wrist and ellbow sx 1yrs ago elbow, 30yrs ago wrist  . Eye surgery     cataract    History   Social History  . Marital Status: Widowed    Spouse Name: N/A    Number of Children: N/A  . Years of Education: N/A   Occupational History  . Not on file.   Social History Main Topics  . Smoking status: Never Smoker   . Smokeless tobacco: Never Used  . Alcohol Use: No  . Drug Use: No  . Sexually Active: Not on file   Other Topics Concern  . Not on file   Social History Narrative  . No narrative  on file    Family History  Problem Relation Age of Onset  . Diabetes Mother   . Stroke Father   . Stroke Sister   . Cancer Sister     ROS: no nausea, vomiting; no fever, chills; no melena, hematochezia; no claudication  PHYSICAL EXAM: BP 142/73  Pulse 61  Ht 5\' 3"  (1.6 m)  Wt 140 lb 8 oz (63.73 kg)  BMI 24.89 kg/m2 GENERAL: 78 year-old female, sitting upright; NAD HEENT: NCAT, PERRLA, EOMI; sclera clear; no xanthelasma NECK: palpable bilateral carotid pulses, no bruits; no JVD; no TM LUNGS: CTA bilaterally CARDIAC: RRR (S1, S2); no significant murmurs; no rubs or gallops ABDOMEN: soft, non-tender; intact BS EXTREMETIES: Stable right radial pulse; no  bruising/hematoma SKIN: warm/dry; no obvious rash/lesions MUSCULOSKELETAL: no joint deformity NEURO: no focal deficit; NL affect   EKG: reviewed and available in Electronic Records   ASSESSMENT & PLAN:

## 2011-06-15 NOTE — Assessment & Plan Note (Signed)
Continue treatment with low-dose aspirin and beta blocker. Patient had a troponin of 5.3 on presentation to the ED, in setting of PAF RVR. Subsequent cardiac catheterization yielded diffuse, nonobstructive CAD with normal LVF.

## 2011-06-15 NOTE — Assessment & Plan Note (Signed)
Continue current dose simvastatin, with recent LDL 61.

## 2011-06-15 NOTE — Assessment & Plan Note (Signed)
Maintaining NSR by EKG today. Of note, however, patient is unaware of palpitations, per daughter's history. Therefore, plan is to have her return in 1 month to see Dr. Andee Lineman, at which time she will need a repeat EKG. She may ultimately need to be placed on a heart monitor, to rule out intermittent atrial fibrillation. At this point in time, however, we will not place her on Coumadin anticoagulation, given recent fall and that this was first episode of documented AF. Of note, she has an elevated CHADS score of 5.

## 2011-06-15 NOTE — Patient Instructions (Signed)
Follow up in 1 month. Your physician recommends that you continue on your current medications as directed. Please refer to the Current Medication list given to you today. 

## 2011-06-15 NOTE — Assessment & Plan Note (Signed)
Stable on current medication regimen 

## 2011-06-21 NOTE — Discharge Summary (Signed)
Patient seen and evaluated.  Had originally considered giving clopidogrel given non STEMI, and PAF, but then made the decision not to provide this.  She will remain on antiplatelet treatment, and been seen in follow up in [place].  Long discussion with the patient and her family.

## 2011-07-10 ENCOUNTER — Encounter: Payer: Self-pay | Admitting: Physician Assistant

## 2011-07-10 ENCOUNTER — Other Ambulatory Visit: Payer: Self-pay | Admitting: Physician Assistant

## 2011-07-10 ENCOUNTER — Telehealth: Payer: Self-pay | Admitting: *Deleted

## 2011-07-10 MED ORDER — METOPROLOL SUCCINATE ER 25 MG PO TB24: 12.5000 mg | ORAL_TABLET | Freq: Every day | ORAL | Status: DC

## 2011-07-10 MED ORDER — AMLODIPINE BESYLATE 5 MG PO TABS: 5.0000 mg | ORAL_TABLET | Freq: Every day | ORAL | Status: DC

## 2011-07-10 MED ORDER — HYDRALAZINE HCL 25 MG PO TABS: 25.0000 mg | ORAL_TABLET | Freq: Three times a day (TID) | ORAL | Status: DC

## 2011-07-10 NOTE — Telephone Encounter (Signed)
Pt's dgt notified and verbalized understanding. Pt's hydralazine will be increased to 20 mg three times a day until she runs out of 10 mg tablets and then she will start 25 mg three times a day. V.O. Gene Serpe, PA-C.

## 2011-07-10 NOTE — Telephone Encounter (Signed)
Pt's dgt, Norma, left message on nurse's VM regarding concerns over elevated BP. She states pt recently had cath following an MI. She has f/u appt in our office on Friday. Pt's BP has been running high (202/80, 185/70) and weakness in her legs. They did an EKG and sent pt home. Pt's dgt would like to know if pt should be seen before Friday or what should be done regarding BP.

## 2011-07-10 NOTE — Telephone Encounter (Signed)
Increase Hydralazine to 20 mg tid, start Norvasc 5 mg daily, and change Lopressor to Toprol XL 12.5 mg daily. Will reassess at f/u later this week.

## 2011-07-14 ENCOUNTER — Ambulatory Visit (INDEPENDENT_AMBULATORY_CARE_PROVIDER_SITE_OTHER): Payer: Medicare Other | Admitting: Physician Assistant

## 2011-07-14 ENCOUNTER — Encounter: Payer: Self-pay | Admitting: Physician Assistant

## 2011-07-14 VITALS — BP 168/82 | HR 65 | Ht 63.0 in | Wt 145.0 lb

## 2011-07-14 DIAGNOSIS — E785 Hyperlipidemia, unspecified: Secondary | ICD-10-CM

## 2011-07-14 DIAGNOSIS — I10 Essential (primary) hypertension: Secondary | ICD-10-CM

## 2011-07-14 DIAGNOSIS — I4891 Unspecified atrial fibrillation: Secondary | ICD-10-CM

## 2011-07-14 MED ORDER — AMLODIPINE BESYLATE 10 MG PO TABS: 10.0000 mg | ORAL_TABLET | Freq: Every day | ORAL | Status: DC

## 2011-07-14 MED ORDER — ASPIRIN 325 MG PO TBEC: 325.0000 mg | DELAYED_RELEASE_TABLET | Freq: Every day | ORAL | Status: DC

## 2011-07-14 NOTE — Assessment & Plan Note (Signed)
Patient remains in NSR by repeat EKG today, as well as by recent EKG in the ED, earlier this week. Given her advanced age and complaint of gait instability, on reemphasize to her and her daughters that she would be at high risk for Coumadin anticoagulation. Her daughters are clearly in agreement with this. Therefore, will increase ASA to 325 daily. She will remain on low-dose beta blocker for rate control. We will continue to monitor closely, have her return to the office followup with myself/Dr. T. in 2 months.

## 2011-07-14 NOTE — Patient Instructions (Signed)
Follow up in 2 months. Increase Aspirin to 325 mg daily. Increase Norvasc (amlodipine) to 10 mg daily. You may take _2_ of your _5_ mg tablets daily until gone, and then get new prescription filled for _10_ mg tablets. A new prescription was sent to your pharmacy to reflect this change.

## 2011-07-14 NOTE — Assessment & Plan Note (Signed)
Continue current low dose of simvastatin, with recent LDL 61.

## 2011-07-14 NOTE — Assessment & Plan Note (Signed)
Blood pressure remains uncontrolled. Will increase Norvasc to full dose at 10 mg daily. Patient is due to follow with Dr. Leandrew Koyanagi next week. I advised her daughters to continue monitoring her BP closely at home, and to report these readings to Dr. Leandrew Koyanagi. Would not increase beta blocker for BP control, given that she has low normal heart rate. As previously outlined, would also not consider treating with an ACE inhibitor, given recent history of acute renal failure (creatinine 1.5).

## 2011-07-14 NOTE — Progress Notes (Signed)
HPI: Patient returns for early scheduled followup, per my recommendation.  At time of last OV, I conferred with Dr. Andee Lineman and we recommended conservative management with ASA, rather than Coumadin, given her multiple comorbidities, advanced age, and recent fall. Moreover, she presented to [place] with one singular episode of PAF. She presented to the [facility] ED earlier this week, with complaint of weakness. One of her 2 daughters today, however, suggested it was more because of persistently elevated BPs in the 200 systolic range. I had recently adjusted her antihypertensive regimen, and started her on Norvasc 5 daily. She presented to the ED with a blood pressure 141/94, and was in NSR. Labs were within normal limits, save for potassium 3.4. TSH was normal. Head CT scan was negative for acute changes. EKG indicated NSR. No medication adjustments, or intervention, was made.  The patient continues to live alone, and ambulates with use of a walker. She complains of legs feeling "weak", but has not had a recent fall. Her daughter's, however, are both quite concerned about her. She denies any CP, SOB, or tachycardia palpitations.  EKG in the office today, reviewed by me, indicates continued NSR.   No Known Allergies  Current Outpatient Prescriptions  Medication Sig Dispense Refill  . ALPRAZolam (XANAX) 0.25 MG tablet Take 0.125-0.25 mg by mouth at bedtime as needed. Take 0.5 tablets (0.125 MG) twice daily, and take 1 tablet (0.25 MG) at bedtime.      Marland Kitchen amLODipine (NORVASC) 5 MG tablet Take 1 tablet (5 mg total) by mouth daily.  30 tablet  6  . aspirin EC 81 MG EC tablet Take 1 tablet (81 mg total) by mouth daily.      . hydrALAZINE (APRESOLINE) 25 MG tablet Take 1 tablet (25 mg total) by mouth 3 (three) times daily.  90 tablet  6  . metoprolol succinate (TOPROL XL) 25 MG 24 hr tablet Take 0.5 tablets (12.5 mg total) by mouth daily.  15 tablet  6  . nitroGLYCERIN (NITROSTAT) 0.4 MG SL tablet Place 1 tablet  (0.4 mg total) under the tongue every 5 (five) minutes x 3 doses as needed for chest pain.  25 tablet  3  . ondansetron (ZOFRAN) 8 MG tablet Take 8 mg by mouth every 8 (eight) hours as needed. For nausea.      . simvastatin (ZOCOR) 20 MG tablet Take 20 mg by mouth every evening.        Past Medical History  Diagnosis Date  . Hypertension   . Diabetes mellitus     borderline  . Stroke     mini stroke  . Anxiety   . Paroxysmal atrial fibrillation   . Diastolic CHF, chronic     Past Surgical History  Procedure Date  . Back surgery   . Cholecystectomy   . Wrist and ellbow sx 83yrs ago elbow, 40yrs ago wrist  . Eye surgery     cataract    History   Social History  . Marital Status: Widowed    Spouse Name: N/A    Number of Children: N/A  . Years of Education: N/A   Occupational History  . Not on file.   Social History Main Topics  . Smoking status: Never Smoker   . Smokeless tobacco: Never Used  . Alcohol Use: No  . Drug Use: No  . Sexually Active: Not on file   Other Topics Concern  . Not on file   Social History Narrative  . No narrative on file  Family History  Problem Relation Age of Onset  . Diabetes Mother   . Stroke Father   . Stroke Sister   . Cancer Sister     ROS: no nausea, vomiting; no fever, chills; no melena, hematochezia; no claudication  PHYSICAL EXAM: There were no vitals taken for this visit. GENERAL: 78 year-old female, sitting upright; NAD  HEENT: NCAT, PERRLA, EOMI; sclera clear; no xanthelasma  NECK: palpable bilateral carotid pulses, no bruits; no JVD; no TM  LUNGS: CTA bilaterally  CARDIAC: RRR (S1, S2); no significant murmurs; no rubs or gallops  ABDOMEN: soft, non-tender; intact BS  EXTREMETIES: Stable right radial pulse; no bruising/hematoma  SKIN: warm/dry; no obvious rash/lesions  MUSCULOSKELETAL: no joint deformity  NEURO: no focal deficit; NL affect    EKG: reviewed and available in Electronic Records   ASSESSMENT  & PLAN:

## 2011-08-07 DIAGNOSIS — I4891 Unspecified atrial fibrillation: Secondary | ICD-10-CM

## 2011-08-10 ENCOUNTER — Encounter: Payer: Medicare Other | Admitting: Hematology and Oncology

## 2011-08-10 DIAGNOSIS — I4891 Unspecified atrial fibrillation: Secondary | ICD-10-CM

## 2011-08-15 ENCOUNTER — Ambulatory Visit (INDEPENDENT_AMBULATORY_CARE_PROVIDER_SITE_OTHER): Payer: Medicare Other | Admitting: *Deleted

## 2011-08-15 DIAGNOSIS — Z7901 Long term (current) use of anticoagulants: Secondary | ICD-10-CM | POA: Insufficient documentation

## 2011-08-15 DIAGNOSIS — I4891 Unspecified atrial fibrillation: Secondary | ICD-10-CM

## 2011-08-15 LAB — POCT INR: INR: 2.4

## 2011-08-18 ENCOUNTER — Ambulatory Visit (INDEPENDENT_AMBULATORY_CARE_PROVIDER_SITE_OTHER): Payer: Medicare Other | Admitting: *Deleted

## 2011-08-18 DIAGNOSIS — I4891 Unspecified atrial fibrillation: Secondary | ICD-10-CM

## 2011-08-18 DIAGNOSIS — Z7901 Long term (current) use of anticoagulants: Secondary | ICD-10-CM

## 2011-08-18 LAB — POCT INR: INR: 1.6

## 2011-08-22 ENCOUNTER — Telehealth: Payer: Self-pay | Admitting: Physician Assistant

## 2011-08-22 NOTE — Telephone Encounter (Signed)
[name] (daughter) is calling stating that he mother is fatigue "states" I just don't feel good. States that her BP is running high. Has appointment with Gene in (2) weeks but feels she can't wait For that appointment. Please call her # [phone] or cell # [phone]

## 2011-08-23 ENCOUNTER — Encounter: Payer: Self-pay | Admitting: Cardiology

## 2011-08-23 ENCOUNTER — Ambulatory Visit (INDEPENDENT_AMBULATORY_CARE_PROVIDER_SITE_OTHER): Payer: Medicare Other | Admitting: Cardiology

## 2011-08-23 VITALS — BP 189/69 | HR 70 | Ht 63.0 in | Wt 145.0 lb

## 2011-08-23 DIAGNOSIS — I495 Sick sinus syndrome: Secondary | ICD-10-CM

## 2011-08-23 DIAGNOSIS — I10 Essential (primary) hypertension: Secondary | ICD-10-CM

## 2011-08-23 DIAGNOSIS — I2510 Atherosclerotic heart disease of native coronary artery without angina pectoris: Secondary | ICD-10-CM

## 2011-08-23 DIAGNOSIS — I4891 Unspecified atrial fibrillation: Secondary | ICD-10-CM

## 2011-08-23 DIAGNOSIS — I5032 Chronic diastolic (congestive) heart failure: Secondary | ICD-10-CM

## 2011-08-23 MED ORDER — ASPIRIN EC 81 MG PO TBEC: 81.0000 mg | DELAYED_RELEASE_TABLET | Freq: Every day | ORAL | Status: AC

## 2011-08-23 MED ORDER — HYDRALAZINE HCL 25 MG PO TABS: 25.0000 mg | ORAL_TABLET | Freq: Three times a day (TID) | ORAL | Status: DC

## 2011-08-23 NOTE — Patient Instructions (Addendum)
   Begin Hydralazine 25mg  three times per day  Begin Aspirin 81mg  daily  Continue all other current medications. Labs:  CBC, BMET - do prior to next office visit  Keep already scheduled visit for 6/21

## 2011-08-23 NOTE — Progress Notes (Signed)
Clinical Summary Ms. Mathison is a 78 y.o.female patient of Dr. Andee Lineman, placed on my schedule today for a followup visit. Recent telephone note from daughter reviewed describing patient as being fatigued and not feeling well, blood pressure elevated, with early followup requested.  I reviewed her recent records including hospital stay for recurrent atrial fibrillation. She was initiated on Coumadin during the recent visit, also amiodarone, manifested bradycardia after converting to sinus rhythm, and underwent medication adjustments. I reviewed Dr. Margarita Mail chart notes, also discharge summary. Patient and her daughter present today have been confused about the medication plan and followup.  Ms. Melchor denies any palpitations, states that she feels "okay" but has been worried about her high blood pressure. She brings in home blood pressure measurements with systolics typically in the 180 to 200 range over diastolics in the 70s to 90s. She denies any chest pain or progressive breathlessness. Has had no falls.  I reviewed her recent medications over the last few months, regimen has included Norvasc, hydralazine, aspirin, initially was not on anticoagulation. Also has had problems with renal insufficiency on ACE inhibitor and diuretic.   No Known Allergies  Current Outpatient Prescriptions  Medication Sig Dispense Refill  . ALPRAZolam (XANAX) 0.25 MG tablet Take 0.25 mg by mouth 3 (three) times daily as needed. Take 0.5 tablets (0.125 MG) twice daily, and take 1 tablet (0.25 MG) at bedtime.      Marland Kitchen amiodarone (PACERONE) 200 MG tablet Take 200 mg by mouth daily.      . nitroGLYCERIN (NITROSTAT) 0.4 MG SL tablet Place 1 tablet (0.4 mg total) under the tongue every 5 (five) minutes x 3 doses as needed for chest pain.  25 tablet  3  . simvastatin (ZOCOR) 20 MG tablet Take 20 mg by mouth every evening.      . traMADol (ULTRAM) 50 MG tablet Take 50 mg by mouth 3 (three) times daily as needed.      .  warfarin (COUMADIN) 5 MG tablet Take 5 mg by mouth daily. Take 1/2 tablet daily      . aspirin EC 81 MG tablet Take 1 tablet (81 mg total) by mouth daily.      . hydrALAZINE (APRESOLINE) 25 MG tablet Take 1 tablet (25 mg total) by mouth 3 (three) times daily.  90 tablet  6    Past Medical History  Diagnosis Date  . Essential hypertension, benign   . Type 2 diabetes mellitus     Borderline  . History of stroke   . Anxiety   . Paroxysmal atrial fibrillation     Deemed poor anticoagulation candidate  . Diastolic CHF, chronic   . Acute renal insufficiency     Avoiding ACE inhibitor  . Mixed hyperlipidemia   . Coronary atherosclerosis of native coronary artery     Moderate nonobstructive disease at catheterization, managed medically  . NSTEMI (non-ST elevated myocardial infarction)     Social History Ms. Albanese reports that she has never smoked. She has never used smokeless tobacco. Ms. Rotan reports that she does not drink alcohol.  Review of Systems No unusual bleeding problems. Appetite is stable. Otherwise negative except as outlined above.  Physical Examination Filed Vitals:   08/23/11 1340  BP: 189/69  Pulse: 70   Normally nourished appearing elderly woman in no acute distress. HEENT: Conjunctiva and lids normal, oropharynx clear. Neck: Supple, no elevated JVP, no thyromegaly. Lungs: Clear to auscultation, nonlabored breathing at rest. Cardiac: Regular rate and rhythm, no S3, 2/6  systolic murmur, no pericardial rub. Abdomen: Soft, nontender, bowel sounds present, no guarding or rebound. Extremities: No pitting edema, distal pulses 2+. Skin: Warm and dry. Musculoskeletal: No kyphosis. Neuropsychiatric: Alert and oriented x3, affect grossly appropriate.   ECG Sinus rhythm at 68 with left anterior fascicular block, decreased R wave progression anteriorly.  Problem List and Plan   Essential hypertension, benign Blood pressure is not controlled at this point,  presently not on any antihypertensives. Recent hospitalization records reviewed as well as prior medical regimen. I have recommended that she resume hydralazine 25 mg t.i.d., keep record of home blood pressure check, and return for scheduled visit on June 21 with Mr. Shara Blazing. She may need further up titration. Will hold off on Norvasc for now in light of concurrent use of amiodarone and Zocor, to reduce the risk of adverse side effects. Followup BMET for next visit.  Atrial fibrillation Presently in normal sinus rhythm by ECG. Continue amiodarone at 200 mg daily, and Coumadin as followed through the Coumadin clinic.  Coronary atherosclerosis of native coronary artery No active angina. Recent cardiac catheterization demonstrated diffuse, moderate, nonobstructive CAD. Would therefore recommend resuming aspirin 81 mg daily, presuming she is able to take this along with Coumadin with no major bleeding issues. Check CBC for next visit.  Sick sinus syndrome Continue observation for now, avoiding further introduction of AV nodal blocking agents.    Jonelle Sidle, M.D., F.A.C.C.

## 2011-08-23 NOTE — Assessment & Plan Note (Signed)
Presently in normal sinus rhythm by ECG. Continue amiodarone at 200 mg daily, and Coumadin as followed through the Coumadin clinic.

## 2011-08-23 NOTE — Assessment & Plan Note (Signed)
Continue observation for now, avoiding further introduction of AV nodal blocking agents.

## 2011-08-23 NOTE — Assessment & Plan Note (Addendum)
No active angina. Recent cardiac catheterization demonstrated diffuse, moderate, nonobstructive CAD. Would therefore recommend resuming aspirin 81 mg daily, presuming she is able to take this along with Coumadin with no major bleeding issues. Check CBC for next visit.

## 2011-08-23 NOTE — Assessment & Plan Note (Signed)
Blood pressure is not controlled at this point, presently not on any antihypertensives. Recent hospitalization records reviewed as well as prior medical regimen. I have recommended that she resume hydralazine 25 mg t.i.d., keep record of home blood pressure check, and return for scheduled visit on June 21 with Mr. Shara Blazing. She may need further up titration. Will hold off on Norvasc for now in light of concurrent use of amiodarone and Zocor, to reduce the risk of adverse side effects. Followup BMET for next visit.

## 2011-08-25 ENCOUNTER — Ambulatory Visit (INDEPENDENT_AMBULATORY_CARE_PROVIDER_SITE_OTHER): Payer: Medicare Other | Admitting: *Deleted

## 2011-08-25 DIAGNOSIS — Z7901 Long term (current) use of anticoagulants: Secondary | ICD-10-CM

## 2011-08-25 DIAGNOSIS — I4891 Unspecified atrial fibrillation: Secondary | ICD-10-CM

## 2011-08-25 LAB — POCT INR: INR: 1.9

## 2011-08-28 ENCOUNTER — Telehealth: Payer: Self-pay | Admitting: Physician Assistant

## 2011-08-28 NOTE — Telephone Encounter (Signed)
[name] called and states that [name] is running high with her BP. States that her BP is 184/80  Around 8:45am  Please call [phone].

## 2011-08-28 NOTE — Telephone Encounter (Signed)
Patient felt heart fluttering last night and BP 203/80 & HR 59. BP this morning 182/75 HR 68. Now doesn't feel any fluttering and wanted to let office know about elevation of BP last night with fluttering.

## 2011-08-29 ENCOUNTER — Telehealth: Payer: Self-pay | Admitting: Physician Assistant

## 2011-08-29 NOTE — Telephone Encounter (Signed)
Nurse called to inform patient to go to ED for evaluation and daughter informed nurse that patient couldn't sit in ED and that she had already sent text message to Dr. Andee Lineman and he informed them that he would come to their house to visit her.

## 2011-08-29 NOTE — Telephone Encounter (Signed)
[name] (daughter) called the office today stating that her mother's BP 203/80 is reading at approximately 30 minutes Ago. Went to [facility] today for blood work and mother is staggering around. States that her head feels "crazy"  Pale, color Is not good. Daughter is concerned about this BP.  # [phone] please call

## 2011-08-31 ENCOUNTER — Telehealth: Payer: Self-pay | Admitting: *Deleted

## 2011-08-31 NOTE — Telephone Encounter (Signed)
Message copied by Eustace Moore on Thu Aug 31, 2011  3:58 PM ------      Message from: MCDOWELL, Illene Bolus      Created: Wed Aug 30, 2011  6:21 PM       Near normal renal function and normal Hgb.  This is Dr. Margarita Mail patient

## 2011-08-31 NOTE — Telephone Encounter (Signed)
Patient's daughter informed and states that she was informed by [facility] during house visit to cancel her appointment with Gene for tomorrow on 21st. Called [facility] and verified this. MD said appointment can be cancelled.

## 2011-09-01 ENCOUNTER — Encounter: Payer: Medicare Other | Admitting: Physician Assistant

## 2011-09-01 ENCOUNTER — Ambulatory Visit (INDEPENDENT_AMBULATORY_CARE_PROVIDER_SITE_OTHER): Payer: Medicare Other | Admitting: *Deleted

## 2011-09-01 DIAGNOSIS — I4891 Unspecified atrial fibrillation: Secondary | ICD-10-CM

## 2011-09-01 DIAGNOSIS — Z7901 Long term (current) use of anticoagulants: Secondary | ICD-10-CM

## 2011-09-13 ENCOUNTER — Other Ambulatory Visit: Payer: Self-pay | Admitting: *Deleted

## 2011-09-13 MED ORDER — HYDRALAZINE HCL 25 MG PO TABS: 25.0000 mg | ORAL_TABLET | Freq: Three times a day (TID) | ORAL | Status: DC

## 2011-09-13 MED ORDER — AMIODARONE HCL 200 MG PO TABS: 200.0000 mg | ORAL_TABLET | Freq: Every day | ORAL | Status: DC

## 2011-09-15 ENCOUNTER — Ambulatory Visit (INDEPENDENT_AMBULATORY_CARE_PROVIDER_SITE_OTHER): Payer: Medicare Other | Admitting: *Deleted

## 2011-09-15 DIAGNOSIS — Z7901 Long term (current) use of anticoagulants: Secondary | ICD-10-CM

## 2011-09-15 DIAGNOSIS — I4891 Unspecified atrial fibrillation: Secondary | ICD-10-CM

## 2011-09-15 MED ORDER — WARFARIN SODIUM 5 MG PO TABS: 5.0000 mg | ORAL_TABLET | Freq: Every day | ORAL | Status: DC

## 2011-09-18 ENCOUNTER — Ambulatory Visit: Payer: Medicare Other | Admitting: Cardiology

## 2011-10-03 ENCOUNTER — Ambulatory Visit (INDEPENDENT_AMBULATORY_CARE_PROVIDER_SITE_OTHER): Payer: Medicare Other | Admitting: *Deleted

## 2011-10-03 DIAGNOSIS — I4891 Unspecified atrial fibrillation: Secondary | ICD-10-CM

## 2011-10-03 DIAGNOSIS — Z7901 Long term (current) use of anticoagulants: Secondary | ICD-10-CM

## 2011-10-20 ENCOUNTER — Ambulatory Visit (INDEPENDENT_AMBULATORY_CARE_PROVIDER_SITE_OTHER): Payer: Medicare Other | Admitting: *Deleted

## 2011-10-20 DIAGNOSIS — Z7901 Long term (current) use of anticoagulants: Secondary | ICD-10-CM

## 2011-10-20 DIAGNOSIS — I4891 Unspecified atrial fibrillation: Secondary | ICD-10-CM

## 2011-10-26 ENCOUNTER — Ambulatory Visit (INDEPENDENT_AMBULATORY_CARE_PROVIDER_SITE_OTHER): Payer: Medicare Other | Admitting: Cardiology

## 2011-10-26 ENCOUNTER — Encounter: Payer: Self-pay | Admitting: Cardiology

## 2011-10-26 VITALS — BP 166/69 | HR 67 | Ht 63.0 in | Wt 149.0 lb

## 2011-10-26 DIAGNOSIS — Z7901 Long term (current) use of anticoagulants: Secondary | ICD-10-CM

## 2011-10-26 DIAGNOSIS — I2510 Atherosclerotic heart disease of native coronary artery without angina pectoris: Secondary | ICD-10-CM

## 2011-10-26 DIAGNOSIS — I10 Essential (primary) hypertension: Secondary | ICD-10-CM

## 2011-10-26 DIAGNOSIS — I4891 Unspecified atrial fibrillation: Secondary | ICD-10-CM

## 2011-10-26 MED ORDER — AMIODARONE HCL 200 MG PO TABS: 200.0000 mg | ORAL_TABLET | Freq: Every day | ORAL | Status: DC

## 2011-10-26 MED ORDER — SPIRONOLACTONE 25 MG PO TABS: 25.0000 mg | ORAL_TABLET | Freq: Every day | ORAL | Status: DC

## 2011-10-26 MED ORDER — CHLORTHALIDONE 25 MG PO TABS: 25.0000 mg | ORAL_TABLET | Freq: Every day | ORAL | Status: AC

## 2011-10-26 NOTE — Assessment & Plan Note (Signed)
Patient remains on Coumadin and compliant with her medical regimen

## 2011-10-26 NOTE — Progress Notes (Signed)
Brittney Bottoms, MD, [facility] Board Certified in Adult Cardiovascular Medicine,Internal Medicine and Critical Care Medicine    CC:  Followup patient with atrial fibrillation on amiodarone                                                                                HPI:        Patient is doing well. She denies any chest pain shortness of breath orthopnea PND. She reports no palpitations. She still reports that her blood pressure somewhat poorly controlled. She has developed significant ankle and feet edema on amlodipine and does not want to continue this medication. She has a history of nonobstructive coronary artery disease but has no recurrent chest pain. Her anxiety also has much improved. I reassured the patient that her blood pressure and is a relatively safe margin.  She reports no other cardiovascular complaints.   PMH: reviewed and listed in Problem List in Electronic Records (and see below) Past Medical History  Diagnosis Date  . Essential hypertension, benign   . Type 2 diabetes mellitus     Borderline  . History of stroke   . Anxiety   . Paroxysmal atrial fibrillation     Deemed poor anticoagulation candidate  . Diastolic CHF, chronic   . Acute renal insufficiency     Avoiding ACE inhibitor  . Mixed hyperlipidemia   . Coronary atherosclerosis of native coronary artery     Moderate nonobstructive disease at catheterization, managed medically  . NSTEMI (non-ST elevated myocardial infarction)    Past Surgical History  Procedure Date  . Back surgery   . Cholecystectomy   . Wrist and ellbow surgery   . Cataract surgery     Allergies/SH/FHX : available in Electronic Records for review  No Known Allergies History   Social History  . Marital Status: Widowed    Spouse Name: N/A    Number of Children: N/A  . Years of Education: N/A   Occupational History  . Not on file.   Social History Main Topics  . Smoking status: Never Smoker   . Smokeless tobacco: Never  Used  . Alcohol Use: No  . Drug Use: No  . Sexually Active: Not on file   Other Topics Concern  . Not on file   Social History Narrative  . No narrative on file   Family History  Problem Relation Age of Onset  . Diabetes Mother   . Stroke Father   . Stroke Sister   . Cancer Sister     Medications: Current Outpatient Prescriptions  Medication Sig Dispense Refill  . amiodarone (PACERONE) 200 MG tablet Take 1 tablet (200 mg total) by mouth daily.  30 tablet  6  . aspirin EC 81 MG tablet Take 1 tablet (81 mg total) by mouth daily.      . clonazePAM (KLONOPIN) 0.5 MG tablet Take 0.5 mg by mouth 2 (two) times daily.      . hydrALAZINE (APRESOLINE) 25 MG tablet Take 25 mg by mouth 4 (four) times daily.      . nitroGLYCERIN (NITROSTAT) 0.4 MG SL tablet Place 1 tablet (0.4 mg total) under the tongue every 5 (five) minutes  x 3 doses as needed for chest pain.  25 tablet  3  . simvastatin (ZOCOR) 20 MG tablet Take 20 mg by mouth every evening.      . traMADol (ULTRAM) 50 MG tablet Take 50 mg by mouth 3 (three) times daily as needed.      . warfarin (COUMADIN) 5 MG tablet Take 1 tablet (5 mg total) by mouth daily. Take 1 tablet daily except 1/2 tablet on S,T,Th  30 tablet  6  . DISCONTD: amiodarone (PACERONE) 200 MG tablet Take 1 tablet (200 mg total) by mouth daily.  45 tablet  0  . chlorthalidone (HYGROTON) 25 MG tablet Take 1 tablet (25 mg total) by mouth daily.  90 tablet  3  . spironolactone (ALDACTONE) 25 MG tablet Take 1 tablet (25 mg total) by mouth daily.  90 tablet  3    ROS: No nausea or vomiting. No fever or chills.No melena or hematochezia.No bleeding.No claudication  Physical Exam: BP 166/69  Pulse 67  Ht 5\' 3"  (1.6 m)  Wt 149 lb (67.586 kg)  BMI 26.39 kg/m2 General: Well-nourished white female in no distress somewhat pale-appearing Neck: Normal carotid upstroke no carotid bruits no thyromegaly nonnodular thyroid JVP 6 cm the vessel bilaterally no  wheezing Lungs: Cardiac: Regular rate and rhythm with normal S1-S2 no murmurs or gallops Vascular: No edema. Normal distal pulses Skin: Warm and dry Physcologic: Normal affect  12lead ECG: Normal sinus rhythm and left bundle branch block Limited bedside ECHO:N/A No images are attached to the encounter.   I reviewed and summarized the old records. I reviewed ECG and prior blood work.  Assessment and Plan  Atrial fibrillation Patient remained in normal sinus rhythm on amiodarone. We will follow heart function tests and liver function test your next clinic visit.  Coronary atherosclerosis of native coronary artery Status post cardiac catheterization of nonobstructive coronary artery disease  Encounter for long-term (current) use of anticoagulants Patient remains on Coumadin and compliant with her medical regimen  Essential hypertension, benign Blood pressure is still poorly controlled and patient does not like amlodipine which causing lower extremity edema. I reviewed her renal function carefully and her creatinine is 1.03 with a GFR of 59 mL per minute. I started her on a combination of chlorthalidone 25 mg a day with spironolactone 25 mg a day. I explained to her daughter that we need to carefully monitor potassium levels and that she will need to have blood drawn date the, a 5, day 7 and then every month x3 months after initiation of spironolactone to make sure she does not develop renal insufficiency or dangerous hyperkalemia. She can continue on hydralazine 4 times a day for right now. Patient has been unable to take ACE inhibitors.    Patient Active Problem List  Diagnosis  . NSTEMI (non-ST elevated myocardial infarction)  . Atrial fibrillation  . Essential hypertension, benign  . Hypokalemia  . Sick sinus syndrome  . Coronary atherosclerosis of native coronary artery  . Dyslipidemia  . Encounter for long-term (current) use of anticoagulants

## 2011-10-26 NOTE — Assessment & Plan Note (Addendum)
Blood pressure is still poorly controlled and patient does not like amlodipine which causing lower extremity edema. I reviewed her renal function carefully and her creatinine is 1.03 with a GFR of 59 mL per minute. I started her on a combination of chlorthalidone 25 mg a day with spironolactone 25 mg a day. I explained to her daughter that we need to carefully monitor potassium levels and that she will need to have blood drawn date the, a 5, day 7 and then every month x3 months after initiation of spironolactone to make sure she does not develop renal insufficiency or dangerous hyperkalemia. She can continue on hydralazine 4 times a day for right now. Patient has been unable to take ACE inhibitors.

## 2011-10-26 NOTE — Assessment & Plan Note (Signed)
Patient remained in normal sinus rhythm on amiodarone. We will follow heart function tests and liver function test your next clinic visit.

## 2011-10-26 NOTE — Patient Instructions (Addendum)
Stop taking Amlodipine.  Start Hygroten 25mg  daily  Start Spironolactone 25mg  daily  Your physician recommends that you schedule a follow-up appointment in: 8 weeks with Dr. Earnestine Leys.   LABS:  TSH, K+, CREA - 10/29/2011  K+ & CREA - 10/31/2011  K+ & CREA - 11/02/2011  K+ & CREA - 11/27/2011  K+ & CREA - 12/26/2011  K+ & CREA - 01/26/2012

## 2011-10-26 NOTE — Assessment & Plan Note (Signed)
Status post cardiac catheterization of nonobstructive coronary artery disease

## 2011-11-06 ENCOUNTER — Telehealth: Payer: Self-pay | Admitting: *Deleted

## 2011-11-06 MED ORDER — HYDRALAZINE HCL 50 MG PO TABS: 50.0000 mg | ORAL_TABLET | Freq: Three times a day (TID) | ORAL | Status: DC

## 2011-11-06 NOTE — Telephone Encounter (Signed)
Notes Recorded by June Leap, MD on 11/04/2011 at 12:55 PM Labs stable- daughter already called me for results and discussed change in therapy. Changed hydralazine to 50mg  PO q8

## 2011-11-06 NOTE — Telephone Encounter (Signed)
Message copied by Lesle Chris on Mon Nov 06, 2011 11:24 AM ------      Message from: Learta Codding      Created: Sat Nov 04, 2011 12:55 PM       Labs stable- daughter already called me for results and discussed change in therapy. Changed hydralazine to 50mg  PO q8

## 2011-11-09 DIAGNOSIS — I4891 Unspecified atrial fibrillation: Secondary | ICD-10-CM

## 2011-11-09 DIAGNOSIS — M549 Dorsalgia, unspecified: Secondary | ICD-10-CM

## 2011-11-21 DIAGNOSIS — I4891 Unspecified atrial fibrillation: Secondary | ICD-10-CM

## 2011-11-21 DIAGNOSIS — M549 Dorsalgia, unspecified: Secondary | ICD-10-CM

## 2011-12-12 ENCOUNTER — Ambulatory Visit: Payer: Medicare Other | Admitting: Cardiology

## 2011-12-18 ENCOUNTER — Telehealth: Payer: Self-pay | Admitting: Cardiology

## 2011-12-18 NOTE — Telephone Encounter (Signed)
Patient will be following [facility]

## 2011-12-20 ENCOUNTER — Ambulatory Visit: Payer: Medicare Other | Admitting: Physician Assistant

## 2011-12-27 ENCOUNTER — Ambulatory Visit: Payer: Medicare Other | Admitting: Cardiology

## 2012-01-03 ENCOUNTER — Ambulatory Visit: Payer: Self-pay | Admitting: *Deleted

## 2012-01-03 DIAGNOSIS — Z7901 Long term (current) use of anticoagulants: Secondary | ICD-10-CM

## 2012-01-03 DIAGNOSIS — I4891 Unspecified atrial fibrillation: Secondary | ICD-10-CM

## 2012-08-03 ENCOUNTER — Emergency Department (HOSPITAL_COMMUNITY): Payer: Medicare Other

## 2012-08-03 ENCOUNTER — Emergency Department (HOSPITAL_COMMUNITY)
Admission: EM | Admit: 2012-08-03 | Discharge: 2012-08-03 | Disposition: A | Discharge status: Home / Self Care | Payer: Medicare Other | Attending: Emergency Medicine | Admitting: Emergency Medicine

## 2012-08-03 ENCOUNTER — Encounter (HOSPITAL_COMMUNITY): Payer: Self-pay | Admitting: *Deleted

## 2012-08-03 DIAGNOSIS — E119 Type 2 diabetes mellitus without complications: Secondary | ICD-10-CM | POA: Insufficient documentation

## 2012-08-03 DIAGNOSIS — E782 Mixed hyperlipidemia: Secondary | ICD-10-CM | POA: Insufficient documentation

## 2012-08-03 DIAGNOSIS — Z9889 Other specified postprocedural states: Secondary | ICD-10-CM | POA: Insufficient documentation

## 2012-08-03 DIAGNOSIS — I2510 Atherosclerotic heart disease of native coronary artery without angina pectoris: Secondary | ICD-10-CM | POA: Insufficient documentation

## 2012-08-03 DIAGNOSIS — F29 Unspecified psychosis not due to a substance or known physiological condition: Secondary | ICD-10-CM | POA: Insufficient documentation

## 2012-08-03 DIAGNOSIS — M6281 Muscle weakness (generalized): Secondary | ICD-10-CM | POA: Insufficient documentation

## 2012-08-03 DIAGNOSIS — Z79899 Other long term (current) drug therapy: Secondary | ICD-10-CM | POA: Insufficient documentation

## 2012-08-03 DIAGNOSIS — I4891 Unspecified atrial fibrillation: Secondary | ICD-10-CM | POA: Insufficient documentation

## 2012-08-03 DIAGNOSIS — G479 Sleep disorder, unspecified: Secondary | ICD-10-CM | POA: Insufficient documentation

## 2012-08-03 DIAGNOSIS — I10 Essential (primary) hypertension: Secondary | ICD-10-CM | POA: Insufficient documentation

## 2012-08-03 DIAGNOSIS — I252 Old myocardial infarction: Secondary | ICD-10-CM | POA: Insufficient documentation

## 2012-08-03 DIAGNOSIS — E871 Hypo-osmolality and hyponatremia: Secondary | ICD-10-CM | POA: Insufficient documentation

## 2012-08-03 DIAGNOSIS — Z87448 Personal history of other diseases of urinary system: Secondary | ICD-10-CM | POA: Insufficient documentation

## 2012-08-03 DIAGNOSIS — F411 Generalized anxiety disorder: Secondary | ICD-10-CM | POA: Insufficient documentation

## 2012-08-03 DIAGNOSIS — I5032 Chronic diastolic (congestive) heart failure: Secondary | ICD-10-CM | POA: Insufficient documentation

## 2012-08-03 DIAGNOSIS — Z8673 Personal history of transient ischemic attack (TIA), and cerebral infarction without residual deficits: Secondary | ICD-10-CM | POA: Insufficient documentation

## 2012-08-03 DIAGNOSIS — Z7902 Long term (current) use of antithrombotics/antiplatelets: Secondary | ICD-10-CM | POA: Insufficient documentation

## 2012-08-03 DIAGNOSIS — Z7982 Long term (current) use of aspirin: Secondary | ICD-10-CM | POA: Insufficient documentation

## 2012-08-03 DIAGNOSIS — F419 Anxiety disorder, unspecified: Secondary | ICD-10-CM

## 2012-08-03 DIAGNOSIS — R32 Unspecified urinary incontinence: Secondary | ICD-10-CM | POA: Insufficient documentation

## 2012-08-03 LAB — URINALYSIS, ROUTINE W REFLEX MICROSCOPIC
Bilirubin Urine: NEGATIVE
Glucose, UA: NEGATIVE mg/dL
Nitrite: NEGATIVE
pH: 7.5 (ref 5.0–8.0)

## 2012-08-03 LAB — CBC WITH DIFFERENTIAL/PLATELET
Eosinophils Relative: 0 % (ref 0–5)
HCT: 33.5 % — ABNORMAL LOW (ref 36.0–46.0)
Hemoglobin: 11.9 g/dL — ABNORMAL LOW (ref 12.0–15.0)
Lymphocytes Relative: 29 % (ref 12–46)
Lymphs Abs: 1.8 10*3/uL (ref 0.7–4.0)
MCV: 85.7 fL (ref 78.0–100.0)
Monocytes Absolute: 0.7 10*3/uL (ref 0.1–1.0)
Monocytes Relative: 11 % (ref 3–12)
Platelets: 235 10*3/uL (ref 150–400)
RBC: 3.91 MIL/uL (ref 3.87–5.11)
WBC: 6.3 10*3/uL (ref 4.0–10.5)

## 2012-08-03 LAB — COMPREHENSIVE METABOLIC PANEL
ALT: 22 U/L (ref 0–35)
BUN: 17 mg/dL (ref 6–23)
CO2: 20 mEq/L (ref 19–32)
Calcium: 9.5 mg/dL (ref 8.4–10.5)
GFR calc Af Amer: 53 mL/min — ABNORMAL LOW (ref >90)
GFR calc non Af Amer: 45 mL/min — ABNORMAL LOW (ref >90)
Glucose, Bld: 116 mg/dL — ABNORMAL HIGH (ref 70–99)
Sodium: 129 mEq/L — ABNORMAL LOW (ref 135–145)

## 2012-08-03 LAB — PROTIME-INR: INR: 1.19 (ref 0.00–1.49)

## 2012-08-03 MED ORDER — LORAZEPAM 1 MG PO TABS
1.0000 mg | ORAL_TABLET | Freq: Once | ORAL | Status: AC
  Administered 2012-08-03: 1 mg via ORAL
  Filled 2012-08-03: qty 1

## 2012-08-03 NOTE — ED Provider Notes (Signed)
History  This chart was scribed for Ward Givens, MD by Bennett Scrape, ED Scribe. This patient was seen in room APA11/APA11 and the patient's care was started at 3:01 PM.  CSN: [id number]  Arrival date & time 08/03/12  1449   First MD Initiated Contact with Patient 08/03/12 1501      Chief Complaint  Patient presents with  . Anxiety     The history is provided by the patient. No language interpreter was used.   HPI Comments: [name] is a 79 y.o. female who presents to the Emergency Department complaining of worsening of chronic anxiety described as "feeling like I'm going crazy", "my head feels strange" and "my insides are shaking" since yesterday.  Pt had a kyphoplasty last year and was at the [facility] nursing home for 10 days during her recovery. While there she was started on coumadin and Lovenox to prevent CVA. She states that she developed a hematoma in abdomen that radiated into her right leg last year and ever since then "I have not felt right". Daughters report that the pt was taken  off of a coumadin 2 to 3 weeks and was then subsequently put on eliquis 3 to 4 months ago for her h/o A. Fib and prior MI. She denies headache, but has trouble thinking and feels like she has a crawling sensation in her head.  Pt states that she is here today because she felt like she was going crazy and her insides were shaking. Daughters state that the pt has been confused since yesterday stating that she has felt a crawling sensation in her head for the past few days and a shaking sensation in the chest and stomach.  Pt states that the antidepressant trazadone and the eliquis  is the cause of her symptoms. She has been on the medication for "several months" per daughters. Daughters report that the pt has had anxiety for most of her life but they deny prior psychiatric admissions or even seeing a psychiatrist. The pt did not take the antidepressant yesterday and has been sleep deprived for  the past 2 nights, not sleeping last night and only getting 4 hours the night before. She was previously on xanax with improvement, last dose was last year in June, but was changed to trazadone by Earnestine Leys and Dr Gerda Diss. Pt has chronic leg weakness since her kyphoplasty that manifests in sitting down or standing up. Daughters state that the weakness has been better since last week. Pt denies CP, nausea, vomiting,diarrhea, constipation, fever, cough and blurred or double vision. She has had stable urinary incontinence.  Pt lives with daughter for the past 5 years  PCP Dr Leandrew Koyanagi  Past Medical History  Diagnosis Date  . Essential hypertension, benign   . Type 2 diabetes mellitus     Borderline  . History of stroke   . Anxiety   . Paroxysmal atrial fibrillation-per daughters pt had a 3 to 4 day stay in the ICU for     Deemed poor anticoagulation candidate  . Diastolic CHF, chronic   . Acute renal insufficiency     Avoiding ACE inhibitor  . Mixed hyperlipidemia   . Coronary atherosclerosis of native coronary artery     Moderate nonobstructive disease at catheterization, managed medically  . NSTEMI (non-ST elevated myocardial infarction)     Past Surgical History  Procedure Laterality Date  . Back surgery    . Cholecystectomy    . Wrist and ellbow surgery    .  Cataract surgery      Family History  Problem Relation Age of Onset  . Diabetes Mother   . Stroke Father   . Stroke Sister   . Cancer Sister     History  Substance Use Topics  . Smoking status: Never Smoker   . Smokeless tobacco: Never Used  . Alcohol Use: No  lives with her daughter  No OB history provided.  Review of Systems  Constitutional: Negative for fever.  Eyes: Negative for visual disturbance.  Respiratory: Negative for cough.   Cardiovascular: Negative for chest pain.  Gastrointestinal: Negative for nausea, vomiting, diarrhea and constipation.  Genitourinary:       Stable urinary incontinence    Psychiatric/Behavioral: Positive for sleep disturbance. The patient is nervous/anxious.   All other systems reviewed and are negative.    Allergies  Review of patient's allergies indicates no known allergies.  Home Medications   Current Outpatient Rx  Name  Route  Sig  Dispense  Refill  . amiodarone (PACERONE) 200 MG tablet   Oral   Take 1 tablet (200 mg total) by mouth daily.   30 tablet   6     Office visit scheduled for 11/05/2011.   Marland Kitchen apixaban (ELIQUIS) 2.5 MG TABS tablet   Oral   Take 2.5 mg by mouth 2 (two) times daily.         Marland Kitchen aspirin EC 81 MG tablet   Oral   Take 1 tablet (81 mg total) by mouth daily.         . chlorthalidone (HYGROTON) 25 MG tablet   Oral   Take 1 tablet (25 mg total) by mouth daily.   90 tablet   3   . clonazePAM (KLONOPIN) 0.5 MG tablet   Oral   Take 0.25 mg by mouth 2 (two) times daily.          . hydrALAZINE (APRESOLINE) 50 MG tablet   Oral   Take 75 mg by mouth 4 (four) times daily.         . nitroGLYCERIN (NITROSTAT) 0.4 MG SL tablet   Sublingual   Place 0.4 mg under the tongue every 5 (five) minutes as needed for chest pain.         . potassium chloride SA (K-DUR,KLOR-CON) 20 MEQ tablet   Oral   Take 20 mEq by mouth daily.         . simvastatin (ZOCOR) 20 MG tablet   Oral   Take 20 mg by mouth every evening.         . traZODone (DESYREL) 100 MG tablet   Oral   Take 50 mg by mouth at bedtime.         . traMADol (ULTRAM) 50 MG tablet   Oral   Take 50 mg by mouth 3 (three) times daily as needed for pain.            Triage Vitals: BP 182/94  Pulse 74  Temp(Src) 98.5 F (36.9 C) (Oral)  Resp 22  SpO2 100%  Vital signs normal except for hypertension  Orthostatics show BP went from 167/65 to 170/60 on standing and HR went from 84 to 70 on standing.    Physical Exam  Nursing note and vitals reviewed. Constitutional: She is oriented to person, place, and time. She appears well-developed and  well-nourished.  Non-toxic appearance. She does not appear ill. No distress.  Pt appears anxious and is breathing fast when she talks about what's going on  HENT:  Head: Normocephalic and atraumatic.  Right Ear: External ear normal.  Left Ear: External ear normal.  Nose: Nose normal. No mucosal edema or rhinorrhea.  Mouth/Throat: Oropharynx is clear and moist and mucous membranes are normal. No dental abscesses or edematous.  Eyes: Conjunctivae and EOM are normal. Pupils are equal, round, and reactive to light.  Neck: Normal range of motion and full passive range of motion without pain. Neck supple.  Cardiovascular: Normal rate, regular rhythm and normal heart sounds.  Exam reveals no gallop and no friction rub.   No murmur heard. Pulmonary/Chest: Effort normal and breath sounds normal. No respiratory distress. She has no wheezes. She has no rhonchi. She has no rales. She exhibits no tenderness and no crepitus.  Abdominal: Soft. Normal appearance and bowel sounds are normal. She exhibits no distension. There is no tenderness. There is no rebound and no guarding.  Musculoskeletal: Normal range of motion. She exhibits no edema and no tenderness.  Moves all extremities well.   Neurological: She is alert and oriented to person, place, and time. She has normal strength. No cranial nerve deficit.  Skin: Skin is warm, dry and intact. No rash noted. No erythema. No pallor.  Psychiatric: Her speech is normal and behavior is normal. Thought content normal. Her mood appears anxious.    ED Course  Procedures (including critical care time)  Medications  LORazepam (ATIVAN) tablet 1 mg (1 mg Oral Given 08/03/12 1655)    DIAGNOSTIC STUDIES: Oxygen Saturation is 100% on room air, normal by my interpretation.    COORDINATION OF CARE: 3:15 PM-Discussed treatment plan which includes CT of head, CBC panel, CMP and UA with pt at bedside and pt agreed to plan. Advised pt that she should follow up with a  psychiatrist.  Review of trazodone side effects shows it can cause hyponatremia and tremor. Eliquis only has bleeding as a side effect.   5:27 PM-Pt rechecked and feels improved with 1 mg Ativan. Informed pt of radiology and lab results. Discussed mild anemia and hyponatremia. Advised pt to stop trazodone and to follow up with a psychiatrist.  Stated that she can take an extra dose of her klonopin if she feels anxious. Pt and daughters state that they are more comfortable following up with her PCP and will do so next week. Addressed pt and family's concerns. Pt states she has had this problem her whole life. States the xanax worked best but her PCP and her cardiologist, Dr Andee Lineman wanted her to stop it. States "I don't care if it's addictive at my age".   PT offered telepsych consult and psychiatric referral several times, but patient and her daughters are not interested.   Results for orders placed during the hospital encounter of 08/03/12  CBC WITH DIFFERENTIAL      Result Value Range   WBC 6.3  4.0 - 10.5 K/uL   RBC 3.91  3.87 - 5.11 MIL/uL   Hemoglobin 11.9 (*) 12.0 - 15.0 g/dL   HCT 25.4 (*) 27.0 - 62.3 %   MCV 85.7  78.0 - 100.0 fL   MCH 30.4  26.0 - 34.0 pg   MCHC 35.5  30.0 - 36.0 g/dL   RDW 76.2  83.1 - 51.7 %   Platelets 235  150 - 400 K/uL   Neutrophils Relative % 60  43 - 77 %   Neutro Abs 3.7  1.7 - 7.7 K/uL   Lymphocytes Relative 29  12 - 46 %   Lymphs Abs  1.8  0.7 - 4.0 K/uL   Monocytes Relative 11  3 - 12 %   Monocytes Absolute 0.7  0.1 - 1.0 K/uL   Eosinophils Relative 0  0 - 5 %   Eosinophils Absolute 0.0  0.0 - 0.7 K/uL   Basophils Relative 1  0 - 1 %   Basophils Absolute 0.0  0.0 - 0.1 K/uL  COMPREHENSIVE METABOLIC PANEL      Result Value Range   Sodium 129 (*) 135 - 145 mEq/L   Potassium 3.5  3.5 - 5.1 mEq/L   Chloride 91 (*) 96 - 112 mEq/L   CO2 20  19 - 32 mEq/L   Glucose, Bld 116 (*) 70 - 99 mg/dL   BUN 17  6 - 23 mg/dL   Creatinine, Ser 1.61  0.50 - 1.10  mg/dL   Calcium 9.5  8.4 - 09.6 mg/dL   Total Protein 7.7  6.0 - 8.3 g/dL   Albumin 4.3  3.5 - 5.2 g/dL   AST 30  0 - 37 U/L   ALT 22  0 - 35 U/L   Alkaline Phosphatase 53  39 - 117 U/L   Total Bilirubin 0.4  0.3 - 1.2 mg/dL   GFR calc non Af Amer 45 (*) >90 mL/min   GFR calc Af Amer 53 (*) >90 mL/min  APTT      Result Value Range   aPTT 35  24 - 37 seconds  PROTIME-INR      Result Value Range   Prothrombin Time 14.9  11.6 - 15.2 seconds   INR 1.19  0.00 - 1.49  URINALYSIS, ROUTINE W REFLEX MICROSCOPIC      Result Value Range   Color, Urine STRAW (*) YELLOW   APPearance CLEAR  CLEAR   Specific Gravity, Urine 1.015  1.005 - 1.030   pH 7.5  5.0 - 8.0   Glucose, UA NEGATIVE  NEGATIVE mg/dL   Hgb urine dipstick NEGATIVE  NEGATIVE   Bilirubin Urine NEGATIVE  NEGATIVE   Ketones, ur TRACE (*) NEGATIVE mg/dL   Protein, ur NEGATIVE  NEGATIVE mg/dL   Urobilinogen, UA 0.2  0.0 - 1.0 mg/dL   Nitrite NEGATIVE  NEGATIVE   Leukocytes, UA NEGATIVE  NEGATIVE   Laboratory interpretation all normal except stable anemia, new mild hyponatremia and low chloride consistent with dehydration    Dg Chest 2 View  08/03/2012   *RADIOLOGY REPORT*  Clinical Data: Anxiety and shortness of breath.  Weakness.  CHEST - 2 VIEW  Comparison: 02/29/2012  Findings: Heart size is normal.  The lungs are free of focal consolidations.  There are small bilateral pleural effusions.  No pulmonary edema.  Previous vertebral plasty.  IMPRESSION: No evidence for acute cardiopulmonary abnormality.   Original Report Authenticated By: Norva Pavlov, M.D.   Ct Head Wo Contrast  08/03/2012   *RADIOLOGY REPORT*  Clinical Data: Anxiety.  Head feels funny.  CT HEAD WITHOUT CONTRAST  Technique:  Contiguous axial images were obtained from the base of the skull through the vertex without contrast.  Comparison: 07/10/2011  Findings: There is mild central cortical atrophy.  Periventricular white matter changes are consistent with small  vessel disease. There is a old infarct involving the right occipital lobe, associated with encephalomalacia. There is no evidence for hemorrhage, mass lesion, or acute infarction.  Bone windows show atherosclerotic calcification of the internal carotid arteries.  No fracture.  IMPRESSION:  1.  Atrophy and small vessel disease. 2.  Periventricular white matter  changes. 3. No evidence for acute intracranial abnormality.   Original Report Authenticated By: Norva Pavlov, M.D.     Date: 08/03/2012  Rate: 67  Rhythm: normal sinus rhythm  QRS Axis: normal  Intervals: normal  ST/T Wave abnormalities: normal  Conduction Disutrbances:left bundle branch block  Narrative Interpretation:   Old EKG Reviewed: unchanged from 06/05/2011 HR was 55    1. Anxiety   2. Hyponatremia    Plan discharge  Devoria Albe, MD, FACEP    MDM   I personally performed the services described in this documentation, which was scribed in my presence. The recorded information has been reviewed and considered.  Devoria Albe, MD, Armando Gang      Ward Givens, MD 08/03/12 [phone]

## 2012-08-03 NOTE — ED Notes (Addendum)
Pt presents to er by [facility] with c/o anxious, not feeling well. Pt denies any pain, symptoms started several days ago, worsening today. Pt thinks that she is having a reaction to some of her medications. EMS reports that pt's family advised that some of pt's medication had been changed lately.

## 2012-08-03 NOTE — ED Notes (Signed)
Pt was dizzy upon standing at time of discharge. Offered to speak with doctor concerning this. Daughter stated, "No,  I will just call her family doctor if we need to."

## 2012-08-03 NOTE — ED Notes (Signed)
Patient was very cooperative and willing to sit, stand, and lay for vital signs.  Patient also requested to walk with assistance to the bathroom, but daughters would not allow it.  Patient then became very anxious and breathing very rapid.  Patient stated she was dizzy at that time.  Patient appears more calm when daughters stepped out of room and we completed our tasks.

## 2012-09-08 ENCOUNTER — Encounter: Payer: Self-pay | Admitting: Cardiovascular Disease

## 2012-09-08 DIAGNOSIS — R42 Dizziness and giddiness: Secondary | ICD-10-CM

## 2012-09-10 ENCOUNTER — Encounter: Payer: Self-pay | Admitting: Cardiology

## 2012-09-10 DIAGNOSIS — I498 Other specified cardiac arrhythmias: Secondary | ICD-10-CM

## 2012-10-18 ENCOUNTER — Encounter: Payer: Self-pay | Admitting: Cardiovascular Disease

## 2012-10-18 ENCOUNTER — Ambulatory Visit (INDEPENDENT_AMBULATORY_CARE_PROVIDER_SITE_OTHER): Payer: Medicare Other | Admitting: Cardiovascular Disease

## 2012-10-18 VITALS — BP 159/68 | HR 60 | Ht 63.0 in | Wt 138.0 lb

## 2012-10-18 DIAGNOSIS — I4891 Unspecified atrial fibrillation: Secondary | ICD-10-CM

## 2012-10-18 DIAGNOSIS — I10 Essential (primary) hypertension: Secondary | ICD-10-CM

## 2012-10-18 DIAGNOSIS — I5032 Chronic diastolic (congestive) heart failure: Secondary | ICD-10-CM

## 2012-10-18 DIAGNOSIS — Z7901 Long term (current) use of anticoagulants: Secondary | ICD-10-CM

## 2012-10-18 DIAGNOSIS — I214 Non-ST elevation (NSTEMI) myocardial infarction: Secondary | ICD-10-CM

## 2012-10-18 DIAGNOSIS — I2510 Atherosclerotic heart disease of native coronary artery without angina pectoris: Secondary | ICD-10-CM

## 2012-10-18 NOTE — Patient Instructions (Signed)
   Monitor blood pressure over next few weeks & mail or bring to office Continue all current medications. Your physician wants you to follow up in: 6 months.  You will receive a reminder letter in the mail one-two months in advance.  If you don't receive a letter, please call our office to schedule the follow up appointment

## 2012-10-18 NOTE — Progress Notes (Signed)
Patient ID: [name], female   DOB: Nov 29, 1924, 79 y.o.   MRN: [id number]    SUBJECTIVE: [name] has a h/o non-obstructive CAD, chronic diastolic heart failure, HTN, and paroxysmal atrial fibrillation, along with hyperlipidemia. She was hospitalized in late June 2014 with benign positional vertigo. She was bradycardic at that time with HR's in the 40 bpm range. She had her BP checked last week at her PCP's office and her daughter tells me it was in the 130/40 mmHg range. However, when she checks it at home, it fluctuates with systolic readings ranging from 150-170 mmHg.  She denies chest pain and seldom has SOB. She struggles with anxiety and stress, and says this started in 1945 when she got married, but said it's been worse lately. One of her sons died of leukemia at the age of 7, which was very difficult for her.       PMH: reviewed and listed in Problem List in Electronic Records (and see below)  Past Medical History   Diagnosis  Date   .  Essential hypertension, benign    .  Type 2 diabetes mellitus      Borderline   .  History of stroke    .  Anxiety    .  Paroxysmal atrial fibrillation        .  Diastolic CHF, chronic    .  Acute renal insufficiency      Avoiding ACE inhibitor   .  Mixed hyperlipidemia    .  Coronary atherosclerosis of native coronary artery      Moderate nonobstructive disease at catheterization, managed medically   .  NSTEMI (non-ST elevated myocardial infarction)     Past Surgical History   Procedure  Date   .  Back surgery    .  Cholecystectomy    .  Wrist and ellbow surgery    .  Cataract surgery         Filed Vitals:   10/18/12 0957  Height: 5\' 3"  (1.6 m)  Weight: 138 lb (62.596 kg)    BP 159/68  Pulse 60    PHYSICAL EXAM General: NAD Neck: No JVD, no thyromegaly or thyroid nodule.  Lungs: Clear to auscultation bilaterally with normal respiratory effort. CV: Nondisplaced PMI.  Heart regular S1/S2, no S3/S4, II/VI  pansystolic murmur.  No peripheral edema.  No carotid bruit.  Normal pedal pulses.  Abdomen: Soft, nontender, no hepatosplenomegaly, no distention.  Neurologic: Alert and oriented x 3.  Psych: Normal affect. Extremities: No clubbing or cyanosis.     LABS: Basic Metabolic Panel: No results found for this basename: NA, K, CL, CO2, GLUCOSE, BUN, CREATININE, CALCIUM, MG, PHOS,  in the last 72 hours Liver Function Tests: No results found for this basename: AST, ALT, ALKPHOS, BILITOT, PROT, ALBUMIN,  in the last 72 hours No results found for this basename: LIPASE, AMYLASE,  in the last 72 hours CBC: No results found for this basename: WBC, NEUTROABS, HGB, HCT, MCV, PLT,  in the last 72 hours Cardiac Enzymes: No results found for this basename: CKTOTAL, CKMB, CKMBINDEX, TROPONINI,  in the last 72 hours BNP: No components found with this basename: POCBNP,  D-Dimer: No results found for this basename: DDIMER,  in the last 72 hours Hemoglobin A1C: No results found for this basename: HGBA1C,  in the last 72 hours Fasting Lipid Panel: No results found for this basename: CHOL, HDL, LDLCALC, TRIG, CHOLHDL, LDLDIRECT,  in the last 72 hours Thyroid Function Tests:  No results found for this basename: TSH, T4TOTAL, FREET3, T3FREE, THYROIDAB,  in the last 72 hours Anemia Panel: No results found for this basename: VITAMINB12, FOLATE, FERRITIN, TIBC, IRON, RETICCTPCT,  in the last 72 hours  ECHO (March 2013): Left ventricle: The cavity size was normal. Wall thickness was normal. Systolic function was normal. The estimated ejection fraction was in the range of 55% to 60%. Features are consistent with a pseudonormal left ventricular filling pattern, with concomitant abnormal relaxation and increased filling pressure (grade 2 diastolic dysfunction). Doppler parameters are consistent with elevated mean left atrial filling pressure. - Aortic valve: Trivial regurgitation. - Mitral valve: Calcified  annulus. Mildly calcified leaflets . Mild regurgitation. - Left atrium: The atrium was mildly to moderately dilated. - Atrial septum: No defect or patent foramen ovale was identified. - Pulmonary arteries: PA peak pressure: 44mm Hg (S).     ASSESSMENT AND PLAN: Paroxysmal Atrial Fibrillation Patient remained in normal sinus rhythm on amiodarone.   Coronary atherosclerosis of native coronary artery  Status post cardiac catheterization of nonobstructive coronary artery disease.  Encounter for long-term (current) use of anticoagulants  Taking Apixaban for anticoagulation, with no bleeding complications. Had been on Warfarin in the past, and developed a hematoma as she was apparently on both Lovenox and Warfarin for an extended period of time.  Essential hypertension, benign  Blood pressure is still poorly controlled, but appears to fluctuate quite a bit. Patient does not like amlodipine which caused lower extremity edema in the past, and has been unable to take ACE inhibitors, for reasons unclear to both the patient and her daughter.   She is taking hydralazine 75 mg four times a day and Chlorthalidone 25 mg daily. I've asked her to check it 4 times a week for the next 2 weeks and to inform me of these values. If it remains uncontrolled, I would consider adding Losartan.  Chronic Diastolic Heart Failure Euvolemic and stable.      Prentice Docker, M.D., F.A.C.C.

## 2013-04-21 ENCOUNTER — Telehealth: Payer: Self-pay | Admitting: *Deleted

## 2013-04-21 ENCOUNTER — Ambulatory Visit (INDEPENDENT_AMBULATORY_CARE_PROVIDER_SITE_OTHER): Payer: Medicare Other | Admitting: Cardiovascular Disease

## 2013-04-21 ENCOUNTER — Encounter: Payer: Self-pay | Admitting: Cardiovascular Disease

## 2013-04-21 VITALS — BP 164/67 | HR 59 | Ht 62.5 in | Wt 140.0 lb

## 2013-04-21 DIAGNOSIS — I10 Essential (primary) hypertension: Secondary | ICD-10-CM

## 2013-04-21 DIAGNOSIS — I495 Sick sinus syndrome: Secondary | ICD-10-CM

## 2013-04-21 DIAGNOSIS — Z7901 Long term (current) use of anticoagulants: Secondary | ICD-10-CM

## 2013-04-21 DIAGNOSIS — I2510 Atherosclerotic heart disease of native coronary artery without angina pectoris: Secondary | ICD-10-CM

## 2013-04-21 DIAGNOSIS — I5032 Chronic diastolic (congestive) heart failure: Secondary | ICD-10-CM

## 2013-04-21 DIAGNOSIS — E785 Hyperlipidemia, unspecified: Secondary | ICD-10-CM

## 2013-04-21 DIAGNOSIS — I4891 Unspecified atrial fibrillation: Secondary | ICD-10-CM

## 2013-04-21 MED ORDER — NITROGLYCERIN 0.4 MG SL SUBL: 0.4000 mg | SUBLINGUAL_TABLET | SUBLINGUAL | Status: AC | PRN

## 2013-04-21 MED ORDER — AMIODARONE HCL 200 MG PO TABS: 100.0000 mg | ORAL_TABLET | Freq: Every day | ORAL | Status: DC

## 2013-04-21 NOTE — Patient Instructions (Signed)
Continue all current medications. Your physician wants you to follow up in: 6 months.  You will receive a reminder letter in the mail one-two months in advance.  If you don't receive a letter, please call our office to schedule the follow up appointment   

## 2013-04-21 NOTE — Telephone Encounter (Signed)
error 

## 2013-04-21 NOTE — Progress Notes (Signed)
Patient ID: [name], female   DOB: 1924-05-14, 80 y.o.   MRN: [id number]      SUBJECTIVE: The patient is an 80 year old woman with a history of CVA, hypertension, hyperlipidemia, nonobstructive coronary artery disease, paroxysmal atrial fibrillation, sick sinus syndrome, and chronic diastolic heart failure. She was hospitalized in late June 2014 with benign positional vertigo. She was bradycardic at that time with HR's in the 40 bpm range.  She had her BP checked last week at her PCP's office (Dr. Leandrew KoyanagiBurdine) and it was reportedly in the 130/60 mmHg range. Her daughter tells me it fluctuates a fair amount. The patient denies chest pain and palpitations, but occasionally has "heart quivering". She also occasionally gets short of breath. Her daughter tells me the patient was diagnosed with pulmonary HTN.      No Known Allergies  Current Outpatient Prescriptions  Medication Sig Dispense Refill  . amiodarone (PACERONE) 200 MG tablet Take 100 mg by mouth daily.      Marland Kitchen. amLODipine (NORVASC) 5 MG tablet Take 5 mg by mouth daily.      Marland Kitchen. apixaban (ELIQUIS) 2.5 MG TABS tablet Take 2.5 mg by mouth 2 (two) times daily.      . calcium-vitamin D (GNP CALCIUM 500/D) 500-200 MG-UNIT per tablet Take 1 tablet by mouth 2 (two) times daily.       . chlorthalidone (HYGROTON) 25 MG tablet Take 1 tablet (25 mg total) by mouth daily.  90 tablet  3  . cholecalciferol (VITAMIN D) 1000 UNITS tablet Take 1,000 Units by mouth daily.       . clonazePAM (KLONOPIN) 0.5 MG tablet Take 0.25 mg by mouth 2 (two) times daily as needed.       Marland Kitchen. escitalopram (LEXAPRO) 10 MG tablet Take 10 mg by mouth daily.      . hydrALAZINE (APRESOLINE) 50 MG tablet Take 75 mg by mouth 4 (four) times daily.      . Multiple Vitamin (MULTIVITAMIN) capsule Take 1 capsule by mouth daily.      . nitroGLYCERIN (NITROSTAT) 0.4 MG SL tablet Place 0.4 mg under the tongue every 5 (five) minutes as needed for chest pain.      . polyethylene  glycol (MIRALAX / GLYCOLAX) packet Take 1 packet by mouth daily. Take 17 g by mouth daily.      . potassium chloride SA (K-DUR,KLOR-CON) 20 MEQ tablet Take 20 mEq by mouth daily.      . simvastatin (ZOCOR) 20 MG tablet Take 20 mg by mouth every evening.      . traMADol (ULTRAM) 50 MG tablet Take 50 mg by mouth 3 (three) times daily as needed for pain.       . traZODone (DESYREL) 100 MG tablet Take 50 mg by mouth at bedtime.       No current facility-administered medications for this visit.    Past Medical History  Diagnosis Date  . Essential hypertension, benign   . Type 2 diabetes mellitus     Borderline  . History of stroke   . Anxiety   . Paroxysmal atrial fibrillation     Deemed poor anticoagulation candidate  . Diastolic CHF, chronic   . Acute renal insufficiency     Avoiding ACE inhibitor  . Mixed hyperlipidemia   . Coronary atherosclerosis of native coronary artery     Moderate nonobstructive disease at catheterization, managed medically  . NSTEMI (non-ST elevated myocardial infarction)     Past Surgical History  Procedure Laterality Date  .  Back surgery    . Cholecystectomy    . Wrist and ellbow surgery    . Cataract surgery      History   Social History  . Marital Status: Widowed    Spouse Name: N/A    Number of Children: N/A  . Years of Education: N/A   Occupational History  . Not on file.   Social History Main Topics  . Smoking status: Never Smoker   . Smokeless tobacco: Never Used  . Alcohol Use: No  . Drug Use: No  . Sexual Activity: Not on file   Other Topics Concern  . Not on file   Social History Narrative  . No narrative on file     Filed Vitals:   04/21/13 1322  BP: 164/67  Pulse: 59  Height: 5' 2.5" (1.588 m)  Weight: 140 lb (63.504 kg)  SpO2: 98%    PHYSICAL EXAM General: NAD  Neck: No JVD, no thyromegaly or thyroid nodule.  Lungs: Clear to auscultation bilaterally with normal respiratory effort.  CV: Nondisplaced PMI.  Heart regular S1/S2, no S3/S4, II/VI pansystolic murmur. No peripheral edema. No carotid bruit. Normal pedal pulses.  Abdomen: Soft, nontender, no hepatosplenomegaly, no distention.  Neurologic: Alert and oriented x 3.  Psych: Normal affect.  Extremities: No clubbing or cyanosis.    ECG: reviewed and available in electronic records.      ASSESSMENT AND PLAN: Paroxysmal Atrial Fibrillation  Patient remains in a regular rhythm on amiodarone.   Coronary atherosclerosis of native coronary artery  Most recent angiogram showed nonobstructive coronary artery disease.   Encounter for long-term (current) use of anticoagulants  Taking Apixaban for anticoagulation, with no bleeding complications.  Had been on Warfarin in the past, and developed a hematoma as she was apparently on both Lovenox and Warfarin for an extended period of time.   Essential hypertension, benign  Blood pressure is still poorly controlled, but appears to fluctuate quite a bit.  Currently on amlodipine 5 mg daily. Has been unable to take ACE inhibitors, for reasons unclear to both the patient and her daughter.  She is taking hydralazine 75 mg four times a day and Chlorthalidone 25 mg daily.  Will continue to monitor. If it remains uncontrolled, I would consider adding Losartan.   Chronic Diastolic Heart Failure  Euvolemic and stable.       Prentice Docker, M.D., F.A.C.C.

## 2013-10-24 ENCOUNTER — Encounter: Payer: Self-pay | Admitting: Cardiovascular Disease

## 2013-10-24 ENCOUNTER — Ambulatory Visit (INDEPENDENT_AMBULATORY_CARE_PROVIDER_SITE_OTHER): Payer: Medicare Other | Admitting: Cardiovascular Disease

## 2013-10-24 VITALS — BP 149/68 | HR 61 | Ht 62.0 in | Wt 130.0 lb

## 2013-10-24 DIAGNOSIS — Z7901 Long term (current) use of anticoagulants: Secondary | ICD-10-CM

## 2013-10-24 DIAGNOSIS — E785 Hyperlipidemia, unspecified: Secondary | ICD-10-CM

## 2013-10-24 DIAGNOSIS — Z79899 Other long term (current) drug therapy: Secondary | ICD-10-CM

## 2013-10-24 DIAGNOSIS — I4891 Unspecified atrial fibrillation: Secondary | ICD-10-CM

## 2013-10-24 DIAGNOSIS — I2510 Atherosclerotic heart disease of native coronary artery without angina pectoris: Secondary | ICD-10-CM

## 2013-10-24 DIAGNOSIS — I480 Paroxysmal atrial fibrillation: Secondary | ICD-10-CM

## 2013-10-24 DIAGNOSIS — I5032 Chronic diastolic (congestive) heart failure: Secondary | ICD-10-CM

## 2013-10-24 DIAGNOSIS — I495 Sick sinus syndrome: Secondary | ICD-10-CM

## 2013-10-24 DIAGNOSIS — I10 Essential (primary) hypertension: Secondary | ICD-10-CM

## 2013-10-24 NOTE — Progress Notes (Signed)
Patient ID: [name], female   DOB: 03/31/1924, 80 y.o.   MRN: [id number]      SUBJECTIVE: The patient is an 80 year old woman with a history of CVA, hypertension, hyperlipidemia, nonobstructive coronary artery disease, paroxysmal atrial fibrillation, sick sinus syndrome, and chronic diastolic heart failure. She says that on some days she feels well and on other days she does not. However, she denies chest pain, exertional dyspnea and palpitations. She walks with a walker at home. She has 18 grandchildren and 20 great-grandchildren. She is here with her daughter [name]. She has 7 daughters and had 2 sons, but one son died at the age of 43 from leukemia.    Review of Systems: As per "subjective", otherwise negative.  No Known Allergies  Current Outpatient Prescriptions  Medication Sig Dispense Refill  . amiodarone (PACERONE) 200 MG tablet Take 0.5 tablets (100 mg total) by mouth daily.  45 tablet  3  . amLODipine (NORVASC) 5 MG tablet Take 5 mg by mouth daily.      Marland Kitchen apixaban (ELIQUIS) 2.5 MG TABS tablet Take 2.5 mg by mouth 2 (two) times daily.      . calcium-vitamin D (GNP CALCIUM 500/D) 500-200 MG-UNIT per tablet Take 1 tablet by mouth 2 (two) times daily.       . cholecalciferol (VITAMIN D) 1000 UNITS tablet Take 1,000 Units by mouth daily.       . clonazePAM (KLONOPIN) 0.5 MG tablet Take 0.25 mg by mouth 2 (two) times daily as needed.       Marland Kitchen escitalopram (LEXAPRO) 10 MG tablet Take 10 mg by mouth daily.      . hydrALAZINE (APRESOLINE) 50 MG tablet Take 75 mg by mouth 4 (four) times daily.      . Multiple Vitamin (MULTIVITAMIN) capsule Take 1 capsule by mouth daily.      . nitroGLYCERIN (NITROSTAT) 0.4 MG SL tablet Place 1 tablet (0.4 mg total) under the tongue every 5 (five) minutes as needed for chest pain.  25 tablet  3  . polyethylene glycol (MIRALAX / GLYCOLAX) packet Take 1 packet by mouth as needed. Take 17 g by mouth daily.      . potassium chloride SA (K-DUR,KLOR-CON)  20 MEQ tablet Take 20 mEq by mouth daily.      . simvastatin (ZOCOR) 20 MG tablet Take 20 mg by mouth every evening.      . traMADol (ULTRAM) 50 MG tablet Take 50 mg by mouth 3 (three) times daily as needed for pain.       . traZODone (DESYREL) 100 MG tablet Take 50 mg by mouth at bedtime.      . chlorthalidone (HYGROTON) 25 MG tablet Take 1 tablet (25 mg total) by mouth daily.  90 tablet  3   No current facility-administered medications for this visit.    Past Medical History  Diagnosis Date  . Essential hypertension, benign   . Type 2 diabetes mellitus     Borderline  . History of stroke   . Anxiety   . Paroxysmal atrial fibrillation     Deemed poor anticoagulation candidate  . Diastolic CHF, chronic   . Acute renal insufficiency     Avoiding ACE inhibitor  . Mixed hyperlipidemia   . Coronary atherosclerosis of native coronary artery     Moderate nonobstructive disease at catheterization, managed medically  . NSTEMI (non-ST elevated myocardial infarction)     Past Surgical History  Procedure Laterality Date  . Back surgery    .  Cholecystectomy    . Wrist and ellbow surgery    . Cataract surgery      History   Social History  . Marital Status: Widowed    Spouse Name: N/A    Number of Children: N/A  . Years of Education: N/A   Occupational History  . Not on file.   Social History Main Topics  . Smoking status: Never Smoker   . Smokeless tobacco: Never Used  . Alcohol Use: No  . Drug Use: No  . Sexual Activity: Not on file   Other Topics Concern  . Not on file   Social History Narrative  . No narrative on file     Filed Vitals:   10/24/13 1138  BP: 149/68  Pulse: 61  Height: 5\' 2"  (1.575 m)  Weight: 130 lb (58.968 kg)  SpO2: 100%    PHYSICAL EXAM General: NAD  Neck: No JVD, no thyromegaly or thyroid nodule.  Lungs: Clear to auscultation bilaterally with normal respiratory effort.  CV: Nondisplaced PMI. Heart regular rate and rhythm, normal  S1/S2, no S3/S4, II/VI ejection systolic murmur best heard over RUSB. No peripheral edema.  Abdomen: Soft, nontender, no hepatosplenomegaly, no distention.  Neurologic: Alert and oriented x 3.  Psych: Normal affect.  Extremities: No clubbing or cyanosis.   ECG: reviewed and available in electronic records.      ASSESSMENT AND PLAN:  Paroxysmal Atrial Fibrillation  Patient remains in a regular rhythm on amiodarone. I am told thyroid function is assessed by PCP every three months and has been normal.  Coronary atherosclerosis of native coronary artery  Asymptomatic. Most recent angiogram showed nonobstructive coronary artery disease.   Encounter for long-term (current) use of anticoagulants  Taking Apixaban for anticoagulation, with no bleeding complications.  Had been on Warfarin in the past, and developed a hematoma as she was apparently on both Lovenox and Warfarin for an extended period of time.   Essential hypertension, benign  Reasonably controlled for age on current therapy.  Chronic Diastolic Heart Failure  Euvolemic and stable.   Dispo: f/u 6 months.  Prentice DockerSuresh Celester Lech, M.D., F.A.C.C.

## 2013-10-24 NOTE — Patient Instructions (Signed)

## 2014-02-19 ENCOUNTER — Encounter (HOSPITAL_COMMUNITY): Payer: Self-pay | Admitting: Internal Medicine

## 2014-04-27 ENCOUNTER — Ambulatory Visit: Payer: Medicare Other | Admitting: Cardiovascular Disease

## 2014-05-01 ENCOUNTER — Other Ambulatory Visit: Payer: Self-pay | Admitting: Cardiovascular Disease

## 2014-05-08 ENCOUNTER — Ambulatory Visit: Payer: Medicare Other | Admitting: Cardiovascular Disease

## 2014-05-15 ENCOUNTER — Telehealth: Payer: Self-pay | Admitting: *Deleted

## 2014-05-15 NOTE — Telephone Encounter (Signed)
Message left on voice mail on 05/08/14 - had office visit today, but cancelled.  Has really bad back condition & can only get up to [place].  What to do about medications, etc...  Attempted to return call - left message.  No return call to present.  Attempted to reach patient again - spoke with son-in-law, stated I would need to speak with Nelva Bushorma (daughter).  [facility] for bladder infection & dehydration.  [facility] - left message for her to call back with any further concerns.  Left message stating that she may have PMD follow her if she prefers, but we typically like to see patients at least yearly if we are going to maintain medication refills.

## 2014-06-10 IMAGING — CR DG CHEST 2V
2 series · 2 of 2 positions shown · non-contrast
Comparison: 02/29/2012

CLINICAL DATA: Anxiety and shortness of breath.  Weakness.

CHEST - 2 VIEW

[view not recorded (1 of 2)]
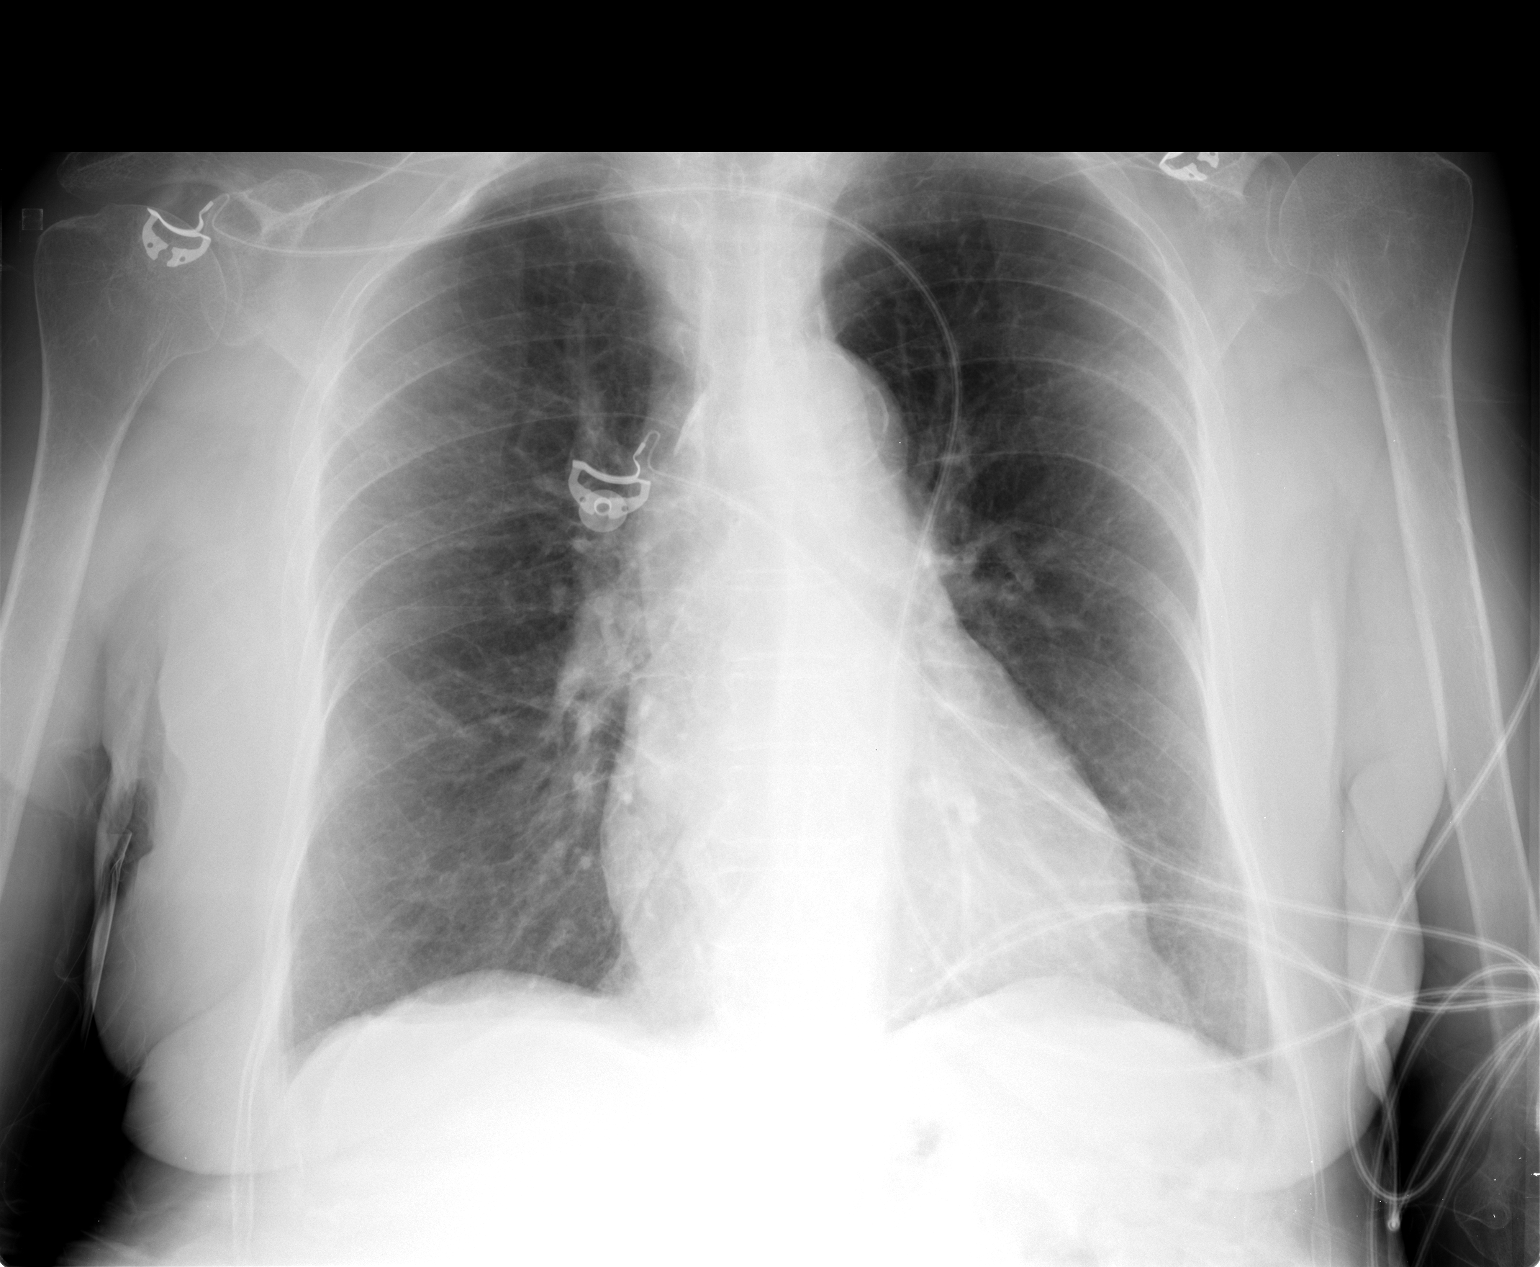

[view not recorded (2 of 2)]
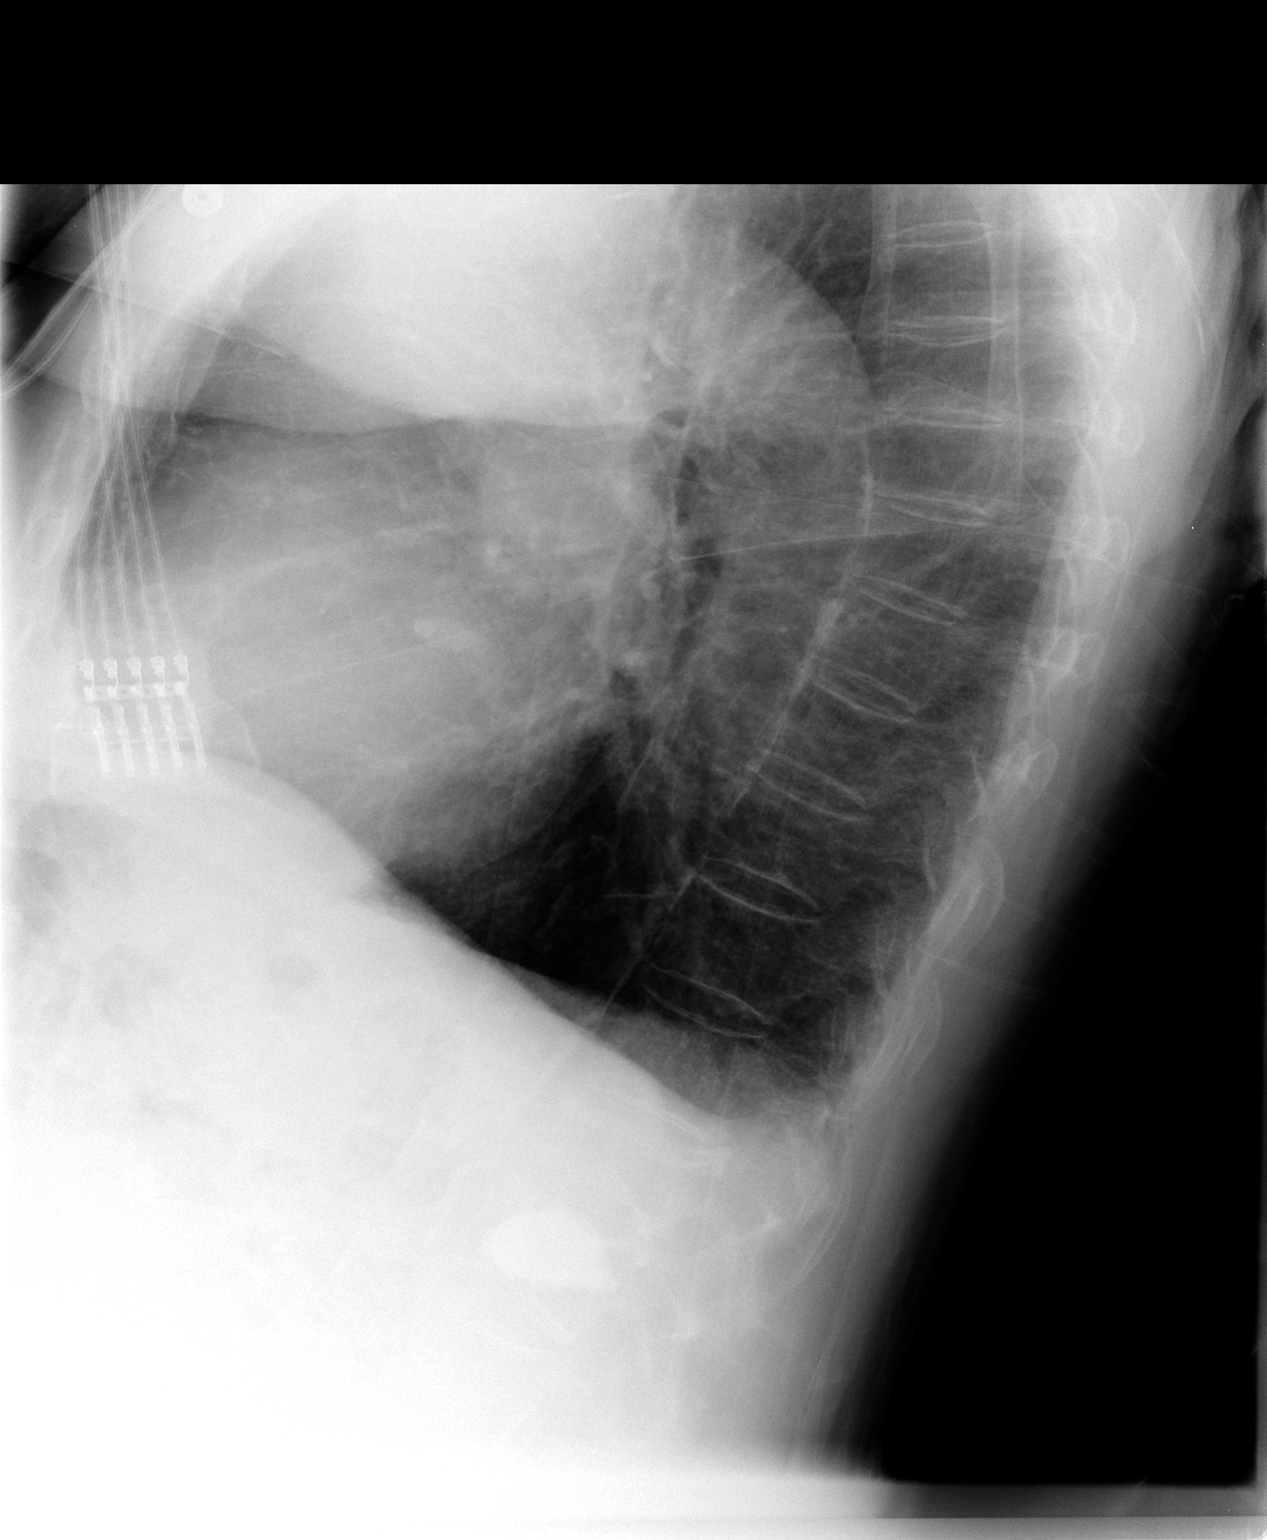

[2 of 2 positions shown; findings below may reference images not displayed]

FINDINGS: Heart size is normal.  The lungs are free of focal
consolidations.  There are small bilateral pleural effusions.  No
pulmonary edema.  Previous vertebral plasty.
IMPRESSION: No evidence for acute cardiopulmonary abnormality.

## 2014-06-10 IMAGING — CT CT HEAD W/O CM
1 series · 16 of 30 positions shown, 20 images · non-contrast
Comparison: 07/10/2011

CLINICAL DATA: Anxiety.  Head feels funny.

CT HEAD WITHOUT CONTRAST
TECHNIQUE: Contiguous axial images were obtained from the base of
the skull through the vertex without contrast.

[Series 2: headtrauma 4.8 h37s · axial · 0.46mm/px · z∈[+92,+225]mm · 16 of 30 slices shown, 20 images]
[im 2/30  brain]
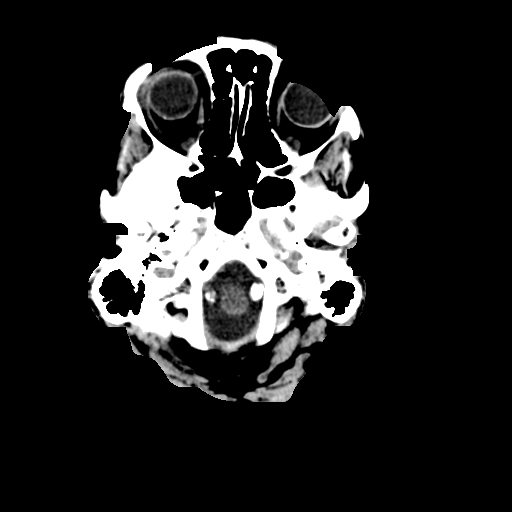
[im 2/30  bone]
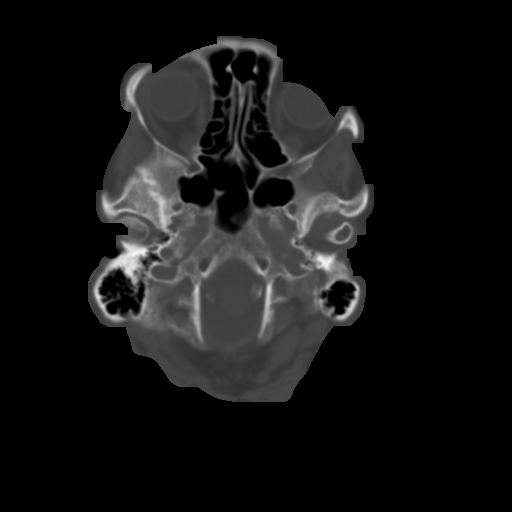
[im 4/30  brain]
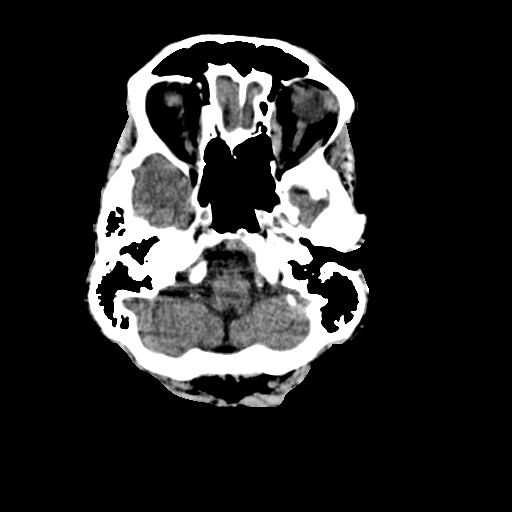
[im 6/30  brain]
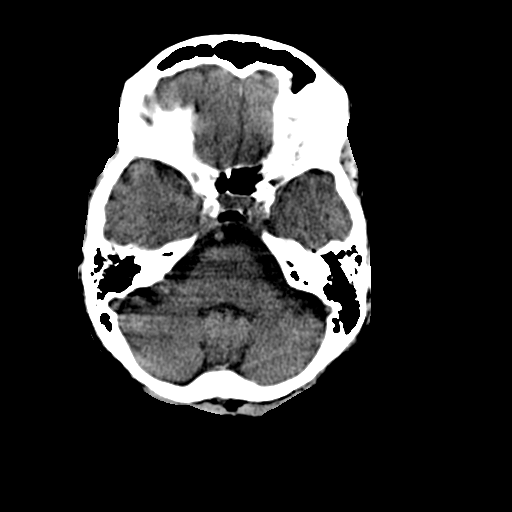
[im 8/30  brain]
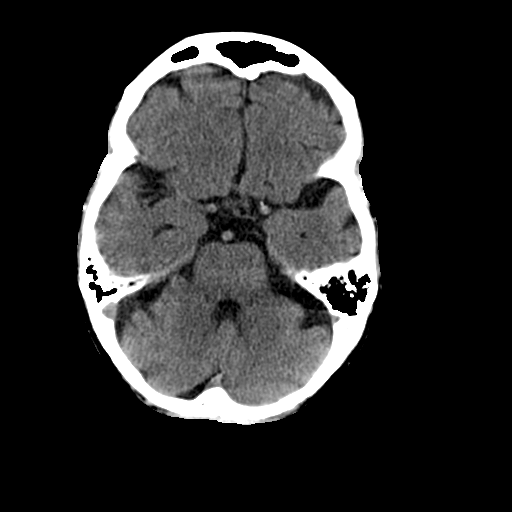
[im 9/30  brain]
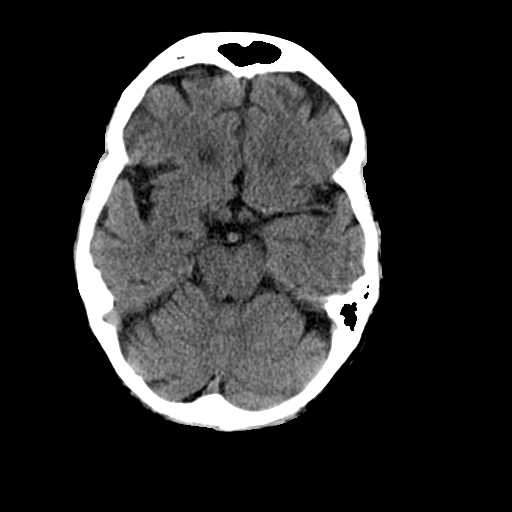
[im 9/30  bone]
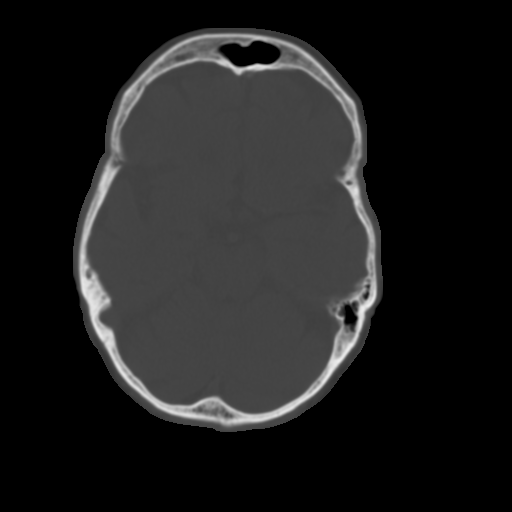
[im 11/30  brain]
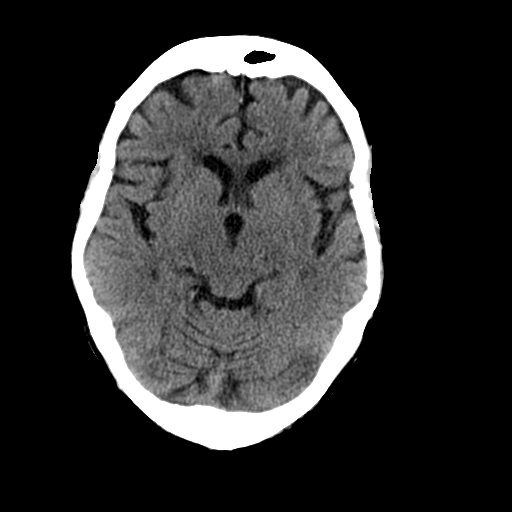
[im 13/30  brain]
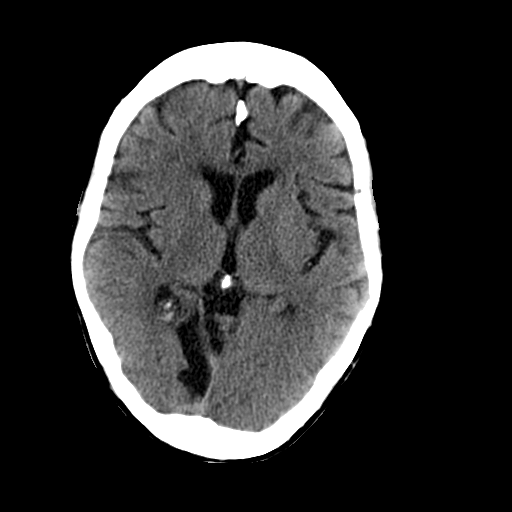
[im 15/30  brain]
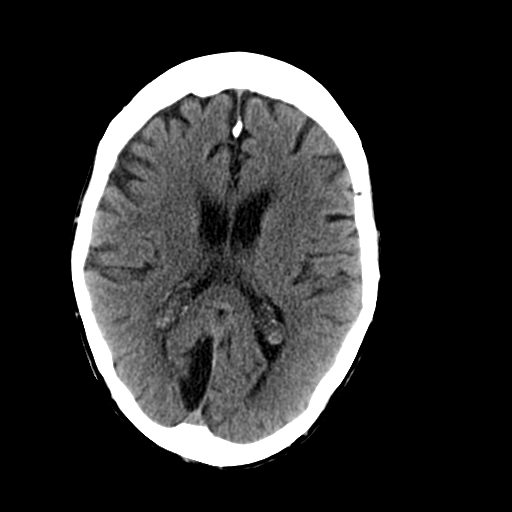
[im 16/30  brain]
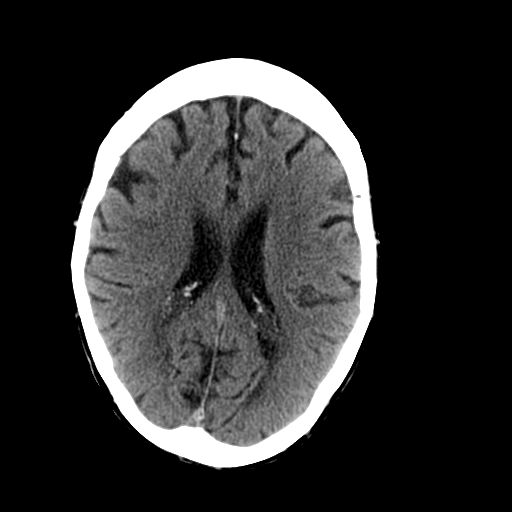
[im 16/30  bone]
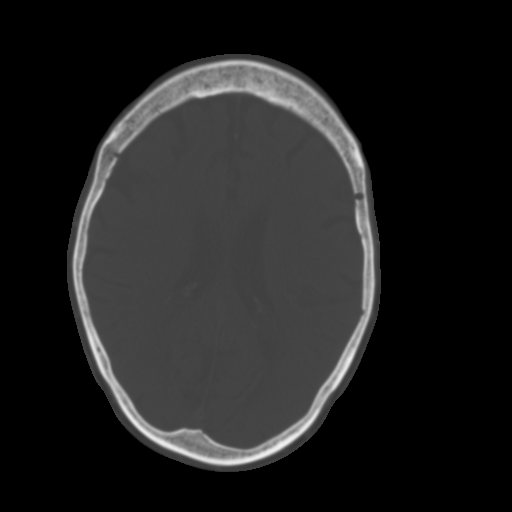
[im 18/30  brain]
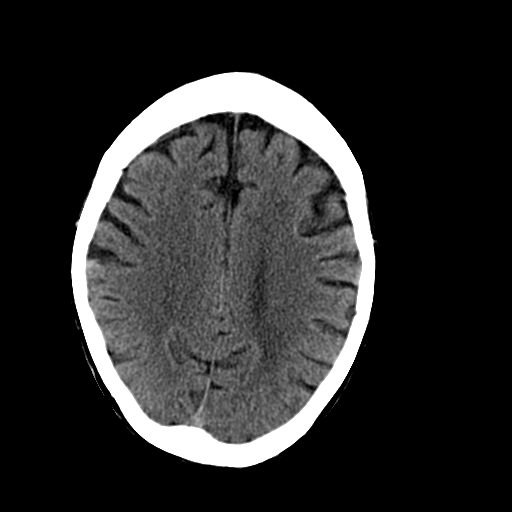
[im 20/30  brain]
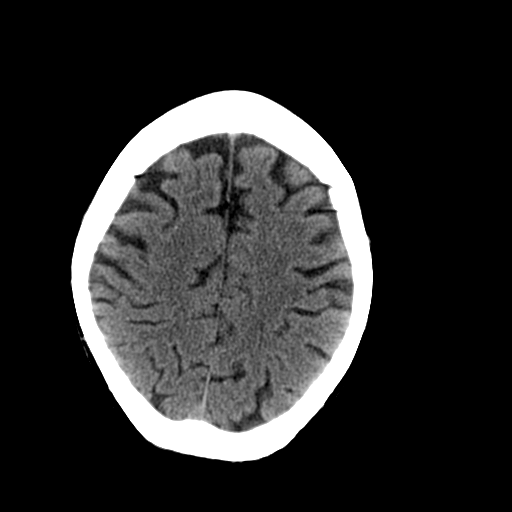
[im 22/30  brain]
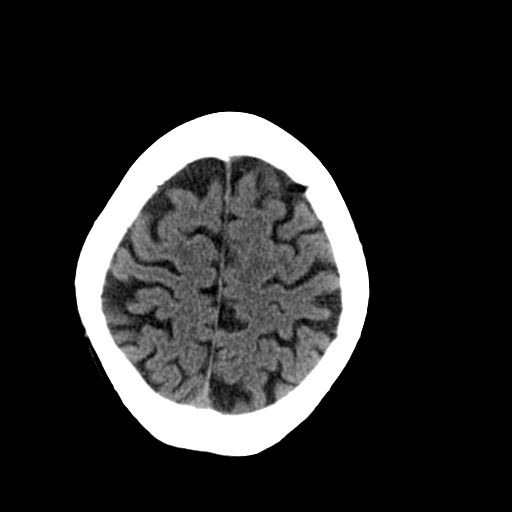
[im 23/30  brain]
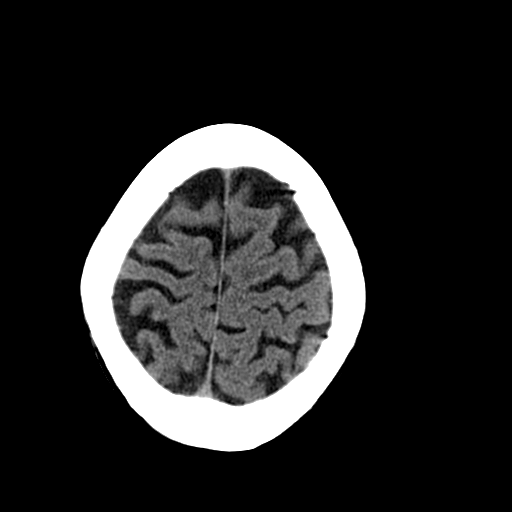
[im 23/30  bone]
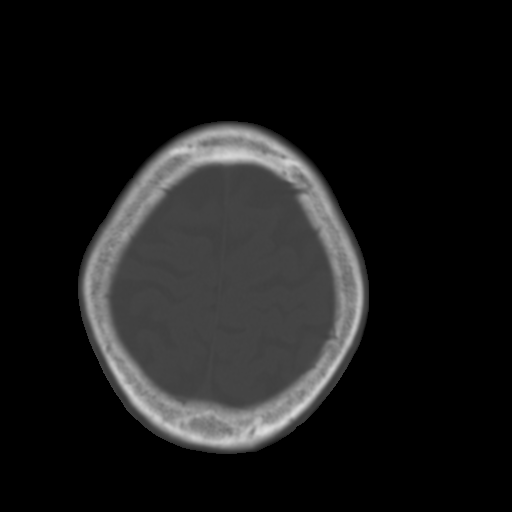
[im 25/30  brain]
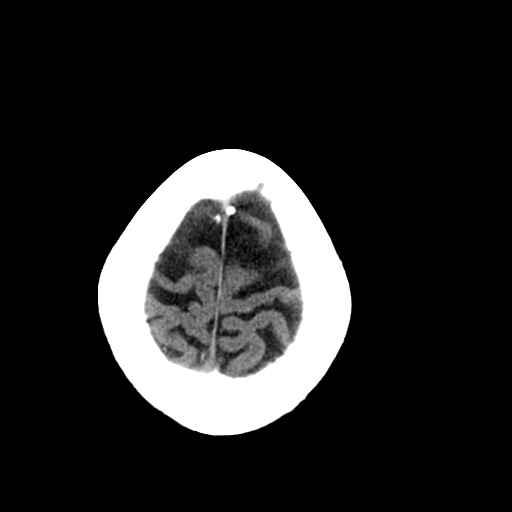
[im 27/30  brain]
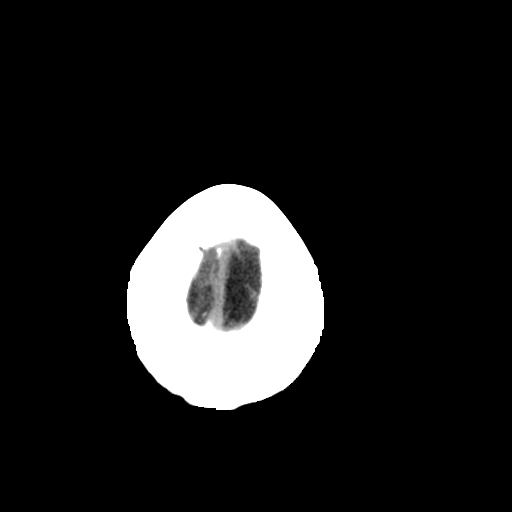
[im 29/30  brain]
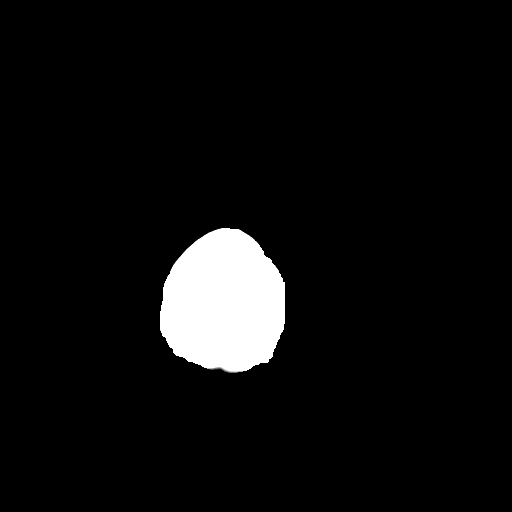

[16 of 30 positions shown; findings below may reference images not displayed]

FINDINGS: There is mild central cortical atrophy.  Periventricular
white matter changes are consistent with small vessel disease.
There is a old infarct involving the right occipital lobe,
associated with encephalomalacia. There is no evidence for
hemorrhage, mass lesion, or acute infarction.

Bone windows show atherosclerotic calcification of the internal
carotid arteries.  No fracture.
IMPRESSION: 1.  Atrophy and small vessel disease.
2.  Periventricular white matter changes.
3. No evidence for acute intracranial abnormality.

## 2014-07-12 DEATH — deceased
